# Patient Record
Sex: Female | Born: 1960 | Hispanic: No | Marital: Married | Smoking: Never smoker
Health system: Southern US, Community
[De-identification: ages and names within clinical notes are randomized; demographics above are authoritative.]

## PROBLEM LIST (undated history)

## (undated) DIAGNOSIS — E079 Disorder of thyroid, unspecified: Secondary | ICD-10-CM

## (undated) DIAGNOSIS — G901 Familial dysautonomia [Riley-Day]: Secondary | ICD-10-CM

## (undated) DIAGNOSIS — F419 Anxiety disorder, unspecified: Secondary | ICD-10-CM

## (undated) DIAGNOSIS — S139XXA Sprain of joints and ligaments of unspecified parts of neck, initial encounter: Secondary | ICD-10-CM

## (undated) DIAGNOSIS — R7303 Prediabetes: Secondary | ICD-10-CM

## (undated) DIAGNOSIS — U099 Post covid-19 condition, unspecified: Secondary | ICD-10-CM

## (undated) DIAGNOSIS — I10 Essential (primary) hypertension: Secondary | ICD-10-CM

## (undated) DIAGNOSIS — M1990 Unspecified osteoarthritis, unspecified site: Secondary | ICD-10-CM

## (undated) DIAGNOSIS — G90A Postural orthostatic tachycardia syndrome (POTS): Secondary | ICD-10-CM

## (undated) DIAGNOSIS — E039 Hypothyroidism, unspecified: Secondary | ICD-10-CM

## (undated) DIAGNOSIS — D649 Anemia, unspecified: Secondary | ICD-10-CM

## (undated) HISTORY — PX: TONSILLECTOMY: SUR1361

## (undated) HISTORY — PX: RECONSTRUCTION MANDIBLE: SUR1082

## (undated) HISTORY — DX: Sprain of joints and ligaments of unspecified parts of neck, initial encounter: S13.9XXA

## (undated) HISTORY — PX: COLONOSCOPY W/ POLYPECTOMY: SHX1380

## (undated) HISTORY — PX: FOREARM SURGERY: SHX651

---
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
## 1990-12-08 HISTORY — PX: RECONSTRUCTION MANDIBLE: SUR1082

## 2006-07-10 ENCOUNTER — Other Ambulatory Visit: Admission: RE | Admit: 2006-07-10 | Discharge: 2006-07-10 | Payer: Self-pay | Admitting: Family Medicine

## 2007-09-22 ENCOUNTER — Other Ambulatory Visit: Admission: RE | Admit: 2007-09-22 | Discharge: 2007-09-22 | Payer: Self-pay | Admitting: Family Medicine

## 2011-12-09 HISTORY — PX: EYE SURGERY: SHX253

## 2014-04-25 ENCOUNTER — Other Ambulatory Visit: Payer: Self-pay | Admitting: Internal Medicine

## 2014-04-25 ENCOUNTER — Encounter (HOSPITAL_COMMUNITY): Payer: 59 | Admitting: Anesthesiology

## 2014-04-25 ENCOUNTER — Encounter (HOSPITAL_COMMUNITY)
Admission: EM | Disposition: A | Discharge status: Home / Self Care | Payer: Self-pay | Source: Home / Self Care | Attending: Emergency Medicine

## 2014-04-25 ENCOUNTER — Encounter (HOSPITAL_COMMUNITY): Payer: Self-pay | Admitting: Emergency Medicine

## 2014-04-25 ENCOUNTER — Observation Stay (HOSPITAL_COMMUNITY)
Admission: EM | Admit: 2014-04-25 | Discharge: 2014-04-26 | Disposition: A | Discharge status: Home / Self Care | Payer: 59 | Attending: Surgery | Admitting: Surgery

## 2014-04-25 ENCOUNTER — Ambulatory Visit
Admission: RE | Admit: 2014-04-25 | Discharge: 2014-04-25 | Disposition: A | Discharge status: Home / Self Care | Payer: 59 | Source: Ambulatory Visit | Attending: Internal Medicine | Admitting: Internal Medicine

## 2014-04-25 ENCOUNTER — Observation Stay (HOSPITAL_COMMUNITY): Payer: 59 | Admitting: Anesthesiology

## 2014-04-25 DIAGNOSIS — K3580 Unspecified acute appendicitis: Secondary | ICD-10-CM

## 2014-04-25 DIAGNOSIS — R109 Unspecified abdominal pain: Secondary | ICD-10-CM

## 2014-04-25 DIAGNOSIS — E079 Disorder of thyroid, unspecified: Secondary | ICD-10-CM | POA: Insufficient documentation

## 2014-04-25 DIAGNOSIS — K3533 Acute appendicitis with perforation and localized peritonitis, with abscess: Principal | ICD-10-CM | POA: Insufficient documentation

## 2014-04-25 DIAGNOSIS — Z9049 Acquired absence of other specified parts of digestive tract: Secondary | ICD-10-CM

## 2014-04-25 HISTORY — DX: Disorder of thyroid, unspecified: E07.9

## 2014-04-25 HISTORY — PX: LAPAROSCOPIC APPENDECTOMY: SHX408

## 2014-04-25 LAB — COMPREHENSIVE METABOLIC PANEL
ALBUMIN: 3.9 g/dL (ref 3.5–5.2)
ALT: 13 U/L (ref 0–35)
AST: 16 U/L (ref 0–37)
Alkaline Phosphatase: 84 U/L (ref 39–117)
BUN: 11 mg/dL (ref 6–23)
CO2: 23 mEq/L (ref 19–32)
Calcium: 9.1 mg/dL (ref 8.4–10.5)
Chloride: 101 mEq/L (ref 96–112)
Creatinine, Ser: 0.49 mg/dL — ABNORMAL LOW (ref 0.50–1.10)
GFR calc Af Amer: >90 mL/min (ref >90)
GFR calc non Af Amer: >90 mL/min (ref >90)
Glucose, Bld: 132 mg/dL — ABNORMAL HIGH (ref 70–99)
POTASSIUM: 4 meq/L (ref 3.7–5.3)
SODIUM: 140 meq/L (ref 137–147)
TOTAL PROTEIN: 7.4 g/dL (ref 6.0–8.3)
Total Bilirubin: 0.6 mg/dL (ref 0.3–1.2)

## 2014-04-25 LAB — URINALYSIS, ROUTINE W REFLEX MICROSCOPIC
Bilirubin Urine: NEGATIVE
Glucose, UA: NEGATIVE mg/dL
Ketones, ur: 40 mg/dL — AB
NITRITE: NEGATIVE
PH: 5.5 (ref 5.0–8.0)
Protein, ur: NEGATIVE mg/dL
SPECIFIC GRAVITY, URINE: 1.015 (ref 1.005–1.030)
UROBILINOGEN UA: 1 mg/dL (ref 0.0–1.0)

## 2014-04-25 LAB — CBC WITH DIFFERENTIAL/PLATELET
BASOS PCT: 0 % (ref 0–1)
Basophils Absolute: 0 10*3/uL (ref 0.0–0.1)
EOS ABS: 0 10*3/uL (ref 0.0–0.7)
EOS PCT: 0 % (ref 0–5)
HCT: 42.8 % (ref 36.0–46.0)
Hemoglobin: 14.5 g/dL (ref 12.0–15.0)
LYMPHS ABS: 0.7 10*3/uL (ref 0.7–4.0)
Lymphocytes Relative: 5 % — ABNORMAL LOW (ref 12–46)
MCH: 30.8 pg (ref 26.0–34.0)
MCHC: 33.9 g/dL (ref 30.0–36.0)
MCV: 90.9 fL (ref 78.0–100.0)
Monocytes Absolute: 1 10*3/uL (ref 0.1–1.0)
Monocytes Relative: 6 % (ref 3–12)
NEUTROS PCT: 89 % — AB (ref 43–77)
Neutro Abs: 13.5 10*3/uL — ABNORMAL HIGH (ref 1.7–7.7)
PLATELETS: 316 10*3/uL (ref 150–400)
RBC: 4.71 MIL/uL (ref 3.87–5.11)
RDW: 12.6 % (ref 11.5–15.5)
WBC: 15.2 10*3/uL — ABNORMAL HIGH (ref 4.0–10.5)

## 2014-04-25 LAB — URINE MICROSCOPIC-ADD ON

## 2014-04-25 LAB — LIPASE, BLOOD: LIPASE: 13 U/L (ref 11–59)

## 2014-04-25 SURGERY — APPENDECTOMY, LAPAROSCOPIC
Anesthesia: General | Site: Abdomen

## 2014-04-25 MED ORDER — SODIUM CHLORIDE 0.9 % IR SOLN
Status: DC | PRN
  Administered 2014-04-25: 1000 mL

## 2014-04-25 MED ORDER — PROGESTERONE MICRONIZED 200 MG PO CAPS
200.0000 mg | ORAL_CAPSULE | Freq: Every day | ORAL | Status: DC
  Administered 2014-04-26: 200 mg via ORAL
  Filled 2014-04-25 (×2): qty 1

## 2014-04-25 MED ORDER — DEXAMETHASONE SODIUM PHOSPHATE 10 MG/ML IJ SOLN
INTRAMUSCULAR | Status: DC | PRN
  Administered 2014-04-25: 4 mg via INTRAVENOUS

## 2014-04-25 MED ORDER — LIDOCAINE HCL (CARDIAC) 20 MG/ML IV SOLN
INTRAVENOUS | Status: DC | PRN
  Administered 2014-04-25: 100 mg via INTRAVENOUS

## 2014-04-25 MED ORDER — MORPHINE SULFATE 2 MG/ML IJ SOLN: 2.0000 mg | INTRAMUSCULAR | Status: DC | PRN

## 2014-04-25 MED ORDER — SODIUM CHLORIDE 0.9 % IV SOLN
INTRAVENOUS | Status: DC | PRN
  Administered 2014-04-25: 19:00:00 via INTRAVENOUS

## 2014-04-25 MED ORDER — BUPIVACAINE-EPINEPHRINE (PF) 0.25% -1:200000 IJ SOLN
INTRAMUSCULAR | Status: AC
  Filled 2014-04-25: qty 30

## 2014-04-25 MED ORDER — DEXTROSE 5 % IV SOLN
1.0000 g | Freq: Once | INTRAVENOUS | Status: AC
  Administered 2014-04-25: 1 g via INTRAVENOUS
  Filled 2014-04-25: qty 10

## 2014-04-25 MED ORDER — MIDAZOLAM HCL 5 MG/5ML IJ SOLN
INTRAMUSCULAR | Status: DC | PRN
  Administered 2014-04-25: 2 mg via INTRAVENOUS

## 2014-04-25 MED ORDER — ONDANSETRON HCL 4 MG/2ML IJ SOLN
INTRAMUSCULAR | Status: DC | PRN
  Administered 2014-04-25: 4 mg via INTRAVENOUS

## 2014-04-25 MED ORDER — MORPHINE SULFATE 4 MG/ML IJ SOLN
4.0000 mg | INTRAMUSCULAR | Status: DC | PRN
  Administered 2014-04-25: 4 mg via INTRAVENOUS
  Filled 2014-04-25: qty 1

## 2014-04-25 MED ORDER — FENTANYL CITRATE 0.05 MG/ML IJ SOLN
INTRAMUSCULAR | Status: AC
  Filled 2014-04-25: qty 5

## 2014-04-25 MED ORDER — SUCCINYLCHOLINE CHLORIDE 20 MG/ML IJ SOLN
INTRAMUSCULAR | Status: DC | PRN
  Administered 2014-04-25: 100 mg via INTRAVENOUS

## 2014-04-25 MED ORDER — SERTRALINE HCL 50 MG PO TABS
50.0000 mg | ORAL_TABLET | Freq: Every day | ORAL | Status: DC
  Administered 2014-04-26: 50 mg via ORAL
  Filled 2014-04-25: qty 1

## 2014-04-25 MED ORDER — ROCURONIUM BROMIDE 50 MG/5ML IV SOLN
INTRAVENOUS | Status: AC
  Filled 2014-04-25: qty 1

## 2014-04-25 MED ORDER — FENTANYL CITRATE 0.05 MG/ML IJ SOLN
INTRAMUSCULAR | Status: DC | PRN
  Administered 2014-04-25: 150 ug via INTRAVENOUS

## 2014-04-25 MED ORDER — MIDAZOLAM HCL 2 MG/2ML IJ SOLN
INTRAMUSCULAR | Status: AC
  Filled 2014-04-25: qty 2

## 2014-04-25 MED ORDER — 0.9 % SODIUM CHLORIDE (POUR BTL) OPTIME
TOPICAL | Status: DC | PRN
  Administered 2014-04-25: 1000 mL

## 2014-04-25 MED ORDER — BUPIVACAINE-EPINEPHRINE 0.25% -1:200000 IJ SOLN
INTRAMUSCULAR | Status: DC | PRN
  Administered 2014-04-25: 15 mL

## 2014-04-25 MED ORDER — ONDANSETRON HCL 4 MG/2ML IJ SOLN
4.0000 mg | INTRAMUSCULAR | Status: DC | PRN
  Administered 2014-04-25: 4 mg via INTRAVENOUS
  Filled 2014-04-25: qty 2

## 2014-04-25 MED ORDER — SODIUM CHLORIDE 0.9 % IV SOLN: INTRAVENOUS | Status: AC

## 2014-04-25 MED ORDER — NEOSTIGMINE METHYLSULFATE 10 MG/10ML IV SOLN
INTRAVENOUS | Status: DC | PRN
  Administered 2014-04-25: 4 mg via INTRAVENOUS

## 2014-04-25 MED ORDER — SODIUM CHLORIDE 0.9 % IV SOLN
INTRAVENOUS | Status: DC
  Administered 2014-04-25: 17:00:00 via INTRAVENOUS

## 2014-04-25 MED ORDER — SUCCINYLCHOLINE CHLORIDE 20 MG/ML IJ SOLN
INTRAMUSCULAR | Status: AC
  Filled 2014-04-25: qty 1

## 2014-04-25 MED ORDER — HEPARIN SODIUM (PORCINE) 5000 UNIT/ML IJ SOLN
5000.0000 [IU] | Freq: Three times a day (TID) | INTRAMUSCULAR | Status: DC
  Administered 2014-04-26: 5000 [IU] via SUBCUTANEOUS
  Filled 2014-04-25 (×3): qty 1

## 2014-04-25 MED ORDER — KCL IN DEXTROSE-NACL 20-5-0.45 MEQ/L-%-% IV SOLN
INTRAVENOUS | Status: DC
  Administered 2014-04-26: 07:00:00 via INTRAVENOUS
  Administered 2014-04-26: 100 mL/h via INTRAVENOUS
  Filled 2014-04-25 (×3): qty 1000

## 2014-04-25 MED ORDER — PROPOFOL 10 MG/ML IV BOLUS
INTRAVENOUS | Status: DC | PRN
Start: 2014-04-25 — End: 2014-04-25
  Administered 2014-04-25: 120 mg via INTRAVENOUS

## 2014-04-25 MED ORDER — ONDANSETRON HCL 4 MG/2ML IJ SOLN: 4.0000 mg | Freq: Four times a day (QID) | INTRAMUSCULAR | Status: DC | PRN

## 2014-04-25 MED ORDER — GLYCOPYRROLATE 0.2 MG/ML IJ SOLN
INTRAMUSCULAR | Status: DC | PRN
  Administered 2014-04-25: 0.6 mg via INTRAVENOUS

## 2014-04-25 MED ORDER — ACETAMINOPHEN 325 MG PO TABS: 650.0000 mg | ORAL_TABLET | ORAL | Status: DC | PRN

## 2014-04-25 MED ORDER — OXYCODONE-ACETAMINOPHEN 5-325 MG PO TABS
1.0000 | ORAL_TABLET | ORAL | Status: DC | PRN
  Administered 2014-04-25 – 2014-04-26 (×2): 1 via ORAL
  Filled 2014-04-25: qty 1
  Filled 2014-04-25: qty 2

## 2014-04-25 MED ORDER — ONDANSETRON HCL 4 MG PO TABS: 4.0000 mg | ORAL_TABLET | Freq: Four times a day (QID) | ORAL | Status: DC | PRN

## 2014-04-25 MED ORDER — METRONIDAZOLE IN NACL 5-0.79 MG/ML-% IV SOLN
500.0000 mg | Freq: Once | INTRAVENOUS | Status: AC
  Administered 2014-04-25: 500 mg via INTRAVENOUS
  Filled 2014-04-25: qty 100

## 2014-04-25 MED ORDER — ROCURONIUM BROMIDE 100 MG/10ML IV SOLN
INTRAVENOUS | Status: DC | PRN
  Administered 2014-04-25: 30 mg via INTRAVENOUS

## 2014-04-25 SURGICAL SUPPLY — 51 items
ADH SKN CLS APL DERMABOND .7 (GAUZE/BANDAGES/DRESSINGS) ×1
APPLIER CLIP ROT 10 11.4 M/L (STAPLE)
APR CLP MED LRG 11.4X10 (STAPLE)
BAG SPEC RTRVL LRG 6X4 10 (ENDOMECHANICALS) ×1
BLADE SURG ROTATE 9660 (MISCELLANEOUS) IMPLANT
CANISTER SUCTION 2500CC (MISCELLANEOUS) ×3 IMPLANT
CHLORAPREP W/TINT 26ML (MISCELLANEOUS) ×3 IMPLANT
CLIP APPLIE ROT 10 11.4 M/L (STAPLE) IMPLANT
COVER SURGICAL LIGHT HANDLE (MISCELLANEOUS) ×5 IMPLANT
CUTTER LINEAR ENDO 35 ETS (STAPLE) IMPLANT
CUTTER LINEAR ENDO 35 ETS TH (STAPLE) ×2 IMPLANT
DECANTER SPIKE VIAL GLASS SM (MISCELLANEOUS) ×3 IMPLANT
DERMABOND ADVANCED (GAUZE/BANDAGES/DRESSINGS) ×2
DERMABOND ADVANCED .7 DNX12 (GAUZE/BANDAGES/DRESSINGS) ×1 IMPLANT
DRAPE UTILITY 15X26 W/TAPE STR (DRAPE) ×6 IMPLANT
ELECT REM PT RETURN 9FT ADLT (ELECTROSURGICAL) ×3
ELECTRODE REM PT RTRN 9FT ADLT (ELECTROSURGICAL) ×1 IMPLANT
ENDOLOOP SUT PDS II  0 18 (SUTURE)
ENDOLOOP SUT PDS II 0 18 (SUTURE) IMPLANT
GLOVE BIO SURGEON STRL SZ8 (GLOVE) ×3 IMPLANT
GLOVE BIOGEL M 6.5 STRL (GLOVE) ×2 IMPLANT
GLOVE BIOGEL PI IND STRL 6.5 (GLOVE) IMPLANT
GLOVE BIOGEL PI IND STRL 8 (GLOVE) ×1 IMPLANT
GLOVE BIOGEL PI INDICATOR 6.5 (GLOVE) ×4
GLOVE BIOGEL PI INDICATOR 8 (GLOVE) ×2
GOWN STRL REUS W/ TWL LRG LVL3 (GOWN DISPOSABLE) ×2 IMPLANT
GOWN STRL REUS W/ TWL XL LVL3 (GOWN DISPOSABLE) ×1 IMPLANT
GOWN STRL REUS W/TWL LRG LVL3 (GOWN DISPOSABLE) ×6
GOWN STRL REUS W/TWL XL LVL3 (GOWN DISPOSABLE) ×3
KIT BASIN OR (CUSTOM PROCEDURE TRAY) ×3 IMPLANT
KIT ROOM TURNOVER OR (KITS) ×3 IMPLANT
NEEDLE 22X1 1/2 (OR ONLY) (NEEDLE) ×3 IMPLANT
NS IRRIG 1000ML POUR BTL (IV SOLUTION) ×3 IMPLANT
PAD ARMBOARD 7.5X6 YLW CONV (MISCELLANEOUS) ×6 IMPLANT
POUCH SPECIMEN RETRIEVAL 10MM (ENDOMECHANICALS) ×3 IMPLANT
RELOAD /EVU35 (ENDOMECHANICALS) IMPLANT
RELOAD CUTTER ETS 35MM STAND (ENDOMECHANICALS) IMPLANT
SCALPEL HARMONIC ACE (MISCELLANEOUS) ×3 IMPLANT
SET IRRIG TUBING LAPAROSCOPIC (IRRIGATION / IRRIGATOR) ×3 IMPLANT
SPECIMEN JAR SMALL (MISCELLANEOUS) ×3 IMPLANT
SUT VIC AB 4-0 PS2 27 (SUTURE) ×3 IMPLANT
SWAB COLLECTION DEVICE MRSA (MISCELLANEOUS) IMPLANT
TOWEL OR 17X24 6PK STRL BLUE (TOWEL DISPOSABLE) ×3 IMPLANT
TOWEL OR 17X26 10 PK STRL BLUE (TOWEL DISPOSABLE) ×3 IMPLANT
TRAY FOLEY CATH 16FR SILVER (SET/KITS/TRAYS/PACK) ×3 IMPLANT
TRAY LAPAROSCOPIC (CUSTOM PROCEDURE TRAY) ×3 IMPLANT
TROCAR XCEL 12X100 BLDLESS (ENDOMECHANICALS) ×3 IMPLANT
TROCAR XCEL BLUNT TIP 100MML (ENDOMECHANICALS) ×3 IMPLANT
TROCAR XCEL NON-BLD 5MMX100MML (ENDOMECHANICALS) ×3 IMPLANT
TUBE ANAEROBIC SPECIMEN COL (MISCELLANEOUS) IMPLANT
WATER STERILE IRR 1000ML POUR (IV SOLUTION) ×1 IMPLANT

## 2014-04-25 NOTE — ED Notes (Signed)
Pt was sent here from pcp office. Pt woke up this am with generalized abd pain and n/v. Had ct scan done and +appendicitis.

## 2014-04-25 NOTE — Anesthesia Preprocedure Evaluation (Signed)
Anesthesia Evaluation  Patient identified by MRN, date of birth, ID band Patient awake    Reviewed: Allergy & Precautions, H&P , NPO status , Patient's Chart, lab work & pertinent test results  Airway Mallampati: I TM Distance: >3 FB Neck ROM: Full    Dental   Pulmonary          Cardiovascular     Neuro/Psych    GI/Hepatic   Endo/Other    Renal/GU      Musculoskeletal   Abdominal   Peds  Hematology   Anesthesia Other Findings   Reproductive/Obstetrics                           Anesthesia Physical Anesthesia Plan  ASA: II  Anesthesia Plan: General   Post-op Pain Management:    Induction: Intravenous, Rapid sequence and Cricoid pressure planned  Airway Management Planned: Oral ETT  Additional Equipment:   Intra-op Plan:   Post-operative Plan: Extubation in OR  Informed Consent: I have reviewed the patients History and Physical, chart, labs and discussed the procedure including the risks, benefits and alternatives for the proposed anesthesia with the patient or authorized representative who has indicated his/her understanding and acceptance.     Plan Discussed with: CRNA and Surgeon  Anesthesia Plan Comments:         Anesthesia Quick Evaluation

## 2014-04-25 NOTE — ED Notes (Signed)
Surgery at bedside.

## 2014-04-25 NOTE — Transfer of Care (Signed)
Immediate Anesthesia Transfer of Care Note  Patient: Julia Murphy  Procedure(s) Performed: Procedure(s): APPENDECTOMY LAPAROSCOPIC (N/A)  Patient Location: PACU  Anesthesia Type:General  Level of Consciousness: awake, alert  and oriented  Airway & Oxygen Therapy: Patient Spontanous Breathing and Patient connected to nasal cannula oxygen  Post-op Assessment: Report given to PACU RN, Post -op Vital signs reviewed and stable and Patient moving all extremities X 4  Post vital signs: Reviewed and stable  Complications: No apparent anesthesia complications

## 2014-04-25 NOTE — ED Notes (Signed)
OR called said they will be available to take the pt shortly and to not send pt to 6N yet.

## 2014-04-25 NOTE — H&P (Signed)
Julia Murphy is an 53 y.o. female.   Chief Complaint: RLQ abdominal pain HPI: patient initially developed lower abdominal pain yesterday morning. She went to work and it seemed to get a little bit better. Last night, she awoke from sleep with more severe pain localizing to the right lower quadrant. She was evaluated by her primary care physician,  Dr. Donette LarryHusain, and referred for CT scan of the abdomen and pelvis. This revealed acute appendicitis and she was sent to the emergency department. She continues to have right lower quadrant pain. She has associated nausea and previously was vomiting.  Past Medical History  Diagnosis Date  . Thyroid disease     History reviewed. No pertinent past surgical history.  History reviewed. No pertinent family history. Social History:  reports that she has never smoked. She does not have any smokeless tobacco history on file. She reports that she drinks alcohol. She reports that she does not use illicit drugs.  Allergies:  Allergies  Allergen Reactions  . Gluten Meal Hives     (Not in a hospital admission)  Results for orders placed during the hospital encounter of 04/25/14 (from the past 48 hour(s))  CBC WITH DIFFERENTIAL     Status: Abnormal   Collection Time    04/25/14  5:14 PM      Result Value Ref Range   WBC 15.2 (*) 4.0 - 10.5 K/uL   RBC 4.71  3.87 - 5.11 MIL/uL   Hemoglobin 14.5  12.0 - 15.0 g/dL   HCT 16.142.8  09.636.0 - 04.546.0 %   MCV 90.9  78.0 - 100.0 fL   MCH 30.8  26.0 - 34.0 pg   MCHC 33.9  30.0 - 36.0 g/dL   RDW 40.912.6  81.111.5 - 91.415.5 %   Platelets 316  150 - 400 K/uL   Neutrophils Relative % 89 (*) 43 - 77 %   Neutro Abs 13.5 (*) 1.7 - 7.7 K/uL   Lymphocytes Relative 5 (*) 12 - 46 %   Lymphs Abs 0.7  0.7 - 4.0 K/uL   Monocytes Relative 6  3 - 12 %   Monocytes Absolute 1.0  0.1 - 1.0 K/uL   Eosinophils Relative 0  0 - 5 %   Eosinophils Absolute 0.0  0.0 - 0.7 K/uL   Basophils Relative 0  0 - 1 %   Basophils Absolute 0.0  0.0 - 0.1 K/uL    Ct Abdomen Pelvis Wo Contrast  04/25/2014   CLINICAL DATA:  Abdominal pain. Since last night extending from the epigastrium to the pelvis with associated nausea vomiting constipation and microscopic hematuria.  EXAM: CT ABDOMEN AND PELVIS WITHOUT CONTRAST  TECHNIQUE: Multidetector CT imaging of the abdomen and pelvis was performed following the standard protocol without IV contrast.  COMPARISON:  Supine and upright abdominal films dated Apr 25, 2014.  FINDINGS: The liver exhibits no ductal dilation. There is a hypodensity in the left lobe anteriorly measuring 8 x 13 mm. It exhibits Hounsfield measurement of +15 and likely reflects a cyst. There is a smaller similar appearing subcapsular finding laterally in the right lobe which exhibits Hounsfield measurement of +3. The gallbladder is adequately distended with no evidence of stones or surrounding inflammatory change. The pancreas, spleen, nondistended stomach, adrenal glands, and kidneys are normal in appearance. The caliber of the abdominal aorta is normal. The periaortic and pericaval regions appear normal. The psoas musculature is normal in appearance.  There is a structure in the right aspect of the  pelvic cavity which contains a small amount of calcification consistent with an inflamed edematous appendix. The maximal measured diameter is approximately 14 mm. There is a small amount of increased density in the periappendiceal fat. The small and large bowel exhibit no acute abnormalities otherwise. The uterus and adnexal structures and urinary bladder are normal in appearance. The lumbar vertebral bodies are preserved in height. The bony pelvis exhibits no acute abnormality. The lung bases are clear.  IMPRESSION: 1. The findings are consistent with an acutely inflamed appendix. The appendix lies deep within the pelvis just lateral to the right adnexal structures. The bowel otherwise appears normal. 2. Hypodensities in the liver likely reflect cysts. No acute  hepatobiliary abnormality is demonstrated. 3. No acute urinary tract abnormality is demonstrated. 4. These results were called by telephone at the time of interpretation on 04/25/2014 at 4:18 PM to Dr. Georgann HousekeeperKARRAR HUSAIN , who verbally acknowledged these results.   Electronically Signed   By: David  [place]   On: 04/25/2014 16:24   Dg Abd 2 Views  04/25/2014   CLINICAL DATA:  Abdominal pain and vomiting  EXAM: ABDOMEN - 2 VIEW  COMPARISON:  None.  FINDINGS: The bowel gas pattern is nonspecific. There small amounts of gas within normal caliber small bowel loops. There is a small amount of gas within normal caliber colon. There is stool and gas in the descending colon. There is gas in the region of the rectum. No free extraluminal gas collections are demonstrated. The lung bases are clear. The lumbar spine and bony pelvis appear normal where visualized.  IMPRESSION: The bowel gas pattern is nonspecific. There is no evidence of obstruction or significant ileus.   Electronically Signed   By: David  [place]   On: 04/25/2014 14:38    Review of Systems  Constitutional: Positive for malaise/fatigue.  HENT: Negative.   Eyes: Negative.   Respiratory: Negative.   Cardiovascular: Negative.   Gastrointestinal: Positive for nausea, vomiting and abdominal pain.  Genitourinary: Negative.   Musculoskeletal:       Left foot fractured metatarsal  Skin: Negative.   Endo/Heme/Allergies: Negative.   Psychiatric/Behavioral: Negative.     Blood pressure 123/76, pulse 89, temperature 99.5 F (37.5 C), temperature source Oral, resp. rate 18, height 5\' 8"  (1.727 m), weight 166 lb 1.6 oz (75.342 kg), SpO2 98.00%. Physical Exam  Constitutional: She is oriented to person, place, and time. She appears well-developed and well-nourished. No distress.  HENT:  Head: Normocephalic and atraumatic.  Nose: Nose normal.  Mouth/Throat: No oropharyngeal exudate.  Eyes: Pupils are equal, round, and reactive to light. No scleral icterus.   Neck: Normal range of motion. Neck supple. No tracheal deviation present.  Cardiovascular: Normal rate and normal heart sounds.   Respiratory: Breath sounds normal. No stridor. No respiratory distress. She has no wheezes. She has no rales.  GI: Soft. She exhibits no distension. There is tenderness. There is no rebound and no guarding.  Tenderness right lower quadrant without guarding, no generalized peritonitis  Musculoskeletal:  Evolving contusion and tenderness left forefoot  Neurological: She is alert and oriented to person, place, and time.  Skin: Skin is warm and dry.  Psychiatric: She has a normal mood and affect.     Assessment/Plan Acute appendicitis: wewill start IV antibiotics and proceed to the operating room tonight for laparoscopic appendectomy. Procedure, risks, and benefits were discussed in detail with the patient and her husband. She is agreeable.  Liz MaladyBurke E Lachae Hohler 04/25/2014, 5:46 PM

## 2014-04-25 NOTE — Op Note (Signed)
04/25/2014  8:00 PM  PATIENT:  Julia Murphy  53 y.o. female  PRE-OPERATIVE DIAGNOSIS:  Acute Appendicitis  POST-OPERATIVE DIAGNOSIS:  Acute Appendicitis  PROCEDURE:  Procedure(s): APPENDECTOMY LAPAROSCOPIC  SURGEON:  Surgeon(s): Liz MaladyBurke E Kemia Wendel, MD  ASSISTANTS: none   ANESTHESIA:   local and general  EBL:  Total I/O In: 400 [I.V.:400] Out: 100 [Urine:100]  BLOOD ADMINISTERED:none  DRAINS: none   SPECIMEN:  Excision  DISPOSITION OF SPECIMEN:  PATHOLOGY  COUNTS:  YES  DICTATION: .Dragon Dictation patient presented to the emergency department with CT confirmed acute appendicitis. She is brought for appendectomy. She received intravenous antibiotics. Informed consent was obtained. She was identified in the preop holding area. She was brought to the operating room and general endotracheal anesthesia was administered by the anesthesia staff. Foley catheter was placed by nursing. Abdomen was prepped and draped in sterile fashion. Time out procedure was done. Infraumbilical region was infiltrated with local. Infraumbilical incision was made. Subcutaneous tissues were dissected down revealing the anterior fascia. This was divided sharply along the midline.peritoneal cavity was entered under direct vision. 0 Vicryl pursestring was placed around the fascial opening. Hassan trocar was inserted. Abdomen was insufflated with carbon dioxide. Under direct vision, a 12 mm left lower quadrant and 5 mm right mid port were placed. Local was used at each port site. Laparoscopic exploration revealed a very inflamed and tense but not perforated appendix. The mesoappendix was divided with harmonic scalpel achieving excellent hemostasis. The base of the appendix was intact. The base was divided with Endo GIA stapler with vascular load. There was excellent staple closure. Appendix was placed in an Endo Catch bag and removed from the left lower quadrant port site. The abdomen was copiously irrigated. There  was no bleeding. Staple line was intact. Ports were removed under direct vision. Pneumoperitoneum was released. Infraumbilical fascia was closed by tying the pursestring. All 3 wounds were copiously irrigated and the skin of each was closed with running 4 Vicryl subcuticular followed by Dermabond. All counts were correct. Patient tolerated procedure well without apparent complication was taken recovery in stable condition.  PATIENT DISPOSITION:  PACU - hemodynamically stable.   Delay start of Pharmacological VTE agent (>24hrs) due to surgical blood loss or risk of bleeding:  no  Violeta GelinasBurke Julia Barthel, MD, MPH, FACS Pager: [phone]  5/19/20158:00 PM

## 2014-04-25 NOTE — ED Notes (Signed)
Pharmacy tech at bedside 

## 2014-04-25 NOTE — Anesthesia Procedure Notes (Signed)
Procedure Name: Intubation Date/Time: 04/25/2014 7:06 PM Performed by: Molli HazardGORDON, [name] Pre-anesthesia Checklist: Patient identified, Emergency Drugs available, Patient being monitored and Suction available Patient Re-evaluated:Patient Re-evaluated prior to inductionOxygen Delivery Method: Circle system utilized Preoxygenation: Pre-oxygenation with 100% oxygen Intubation Type: IV induction, Rapid sequence and Cricoid Pressure applied Laryngoscope Size: Miller and 2 Grade View: Grade I Tube type: Oral Tube size: 7.5 mm Number of attempts: 1 Airway Equipment and Method: Stylet Placement Confirmation: ETT inserted through vocal cords under direct vision,  positive ETCO2 and breath sounds checked- equal and bilateral Secured at: 23 cm Tube secured with: Tape Dental Injury: Teeth and Oropharynx as per pre-operative assessment

## 2014-04-25 NOTE — ED Notes (Signed)
Paperwork from MD faxed to [facility]

## 2014-04-25 NOTE — ED Notes (Signed)
Dr. Clarene DukeMcmanus at bedside, aware of pt's request for pain medicine.

## 2014-04-25 NOTE — ED Notes (Signed)
Pt c/o RLQ abd pain that radiates to mid lower abd and then up into mid-epigastric area, sts the pain started yesterday and worsened throughout the night so she called PCP and then had a CT scan. Scan showed she has appendicitis. Pt c/o extreme nasusea and pain. sts she did take some phenergan PTA and has gotten some relief from that. Nad, skin warm and dry, resp e/u.

## 2014-04-25 NOTE — ED Provider Notes (Signed)
CSN: [id number]     Arrival date & time 04/25/14  1637 History   First MD Initiated Contact with Patient 04/25/14 1656     Chief Complaint  Patient presents with  . Abdominal Pain      HPI Pt was seen at 1730.  Per pt, c/o gradual onset and worsening of persistent generalized abd "pain" since yesterday.  Has been associated with multiple intermittent episodes of N/V. Pt was evaluated by her PMD today, and sent to the hospital for an outpatient CT scan.  Pt was called and told to come to the ED for further evaluation/admission for dx acute appendicitis seen on CT scan.  Denies diarrhea, no fevers, no back pain, no rash, no CP/SOB, no black or blood in stools or emesis.      Past Medical History  Diagnosis Date  . Thyroid disease    History reviewed. No pertinent past surgical history.  History  Substance Use Topics  . Smoking status: Never Smoker   . Smokeless tobacco: Not on file  . Alcohol Use: Yes     Comment: occ    Review of Systems ROS: Statement: All systems negative except as marked or noted in the HPI; Constitutional: Negative for fever and chills. ; ; Eyes: Negative for eye pain, redness and discharge. ; ; ENMT: Negative for ear pain, hoarseness, nasal congestion, sinus pressure and sore throat. ; ; Cardiovascular: Negative for chest pain, palpitations, diaphoresis, dyspnea and peripheral edema. ; ; Respiratory: Negative for cough, wheezing and stridor. ; ; Gastrointestinal: +N/V, abd pain. Negative for diarrhea, blood in stool, hematemesis, jaundice and rectal bleeding. . ; ; Genitourinary: Negative for dysuria, flank pain and hematuria. ; ; Musculoskeletal: Negative for back pain and neck pain. Negative for swelling and trauma.; ; Skin: Negative for pruritus, rash, abrasions, blisters, bruising and skin lesion.; ; Neuro: Negative for headache, lightheadedness and neck stiffness. Negative for weakness, altered level of consciousness , altered mental status, extremity weakness,  paresthesias, involuntary movement, seizure and syncope.        Allergies  Gluten meal  Home Medications   Prior to Admission medications   Medication Sig Start Date End Date Taking? Authorizing Provider  Thyroid (NATURE-THROID) 113.75 MG TABS Take 1 tablet by mouth 2 (two) times daily.   Yes Historical Provider, MD   BP 123/70  Pulse 82  Temp(Src) 99.5 F (37.5 C) (Oral)  Resp 18  Ht 5\' 8"  (1.727 m)  Wt 166 lb 1.6 oz (75.342 kg)  BMI 25.26 kg/m2  SpO2 93% Physical Exam 1735: Physical examination:  Nursing notes reviewed; Vital signs and O2 SAT reviewed;  Constitutional: Well developed, Well nourished, Well hydrated, Uncomfortable appearing.; Head:  Normocephalic, atraumatic; Eyes: EOMI, PERRL, No scleral icterus; ENMT: Mouth and pharynx normal, Mucous membranes moist; Neck: Supple, Full range of motion, No lymphadenopathy; Cardiovascular: Regular rate and rhythm, No murmur, rub, or gallop; Respiratory: Breath sounds clear & equal bilaterally, No rales, rhonchi, wheezes.  Speaking full sentences with ease, Normal respiratory effort/excursion; Chest: Nontender, Movement normal; Abdomen: Soft, +RLQ tenderness to palp. Nondistended, Normal bowel sounds; Genitourinary: No CVA tenderness; Extremities: Pulses normal, No tenderness, No edema, No calf edema or asymmetry.; Neuro: AA&Ox3, Major CN grossly intact.  Speech clear. No gross focal motor or sensory deficits in extremities.; Skin: Color normal, Warm, Dry.   ED Course  Procedures     EKG Interpretation None      MDM  MDM Reviewed: previous chart, nursing note and vitals Reviewed previous: labs  and CT scan    Results for orders placed during the hospital encounter of 04/25/14  CBC WITH DIFFERENTIAL      Result Value Ref Range   WBC 15.2 (*) 4.0 - 10.5 K/uL   RBC 4.71  3.87 - 5.11 MIL/uL   Hemoglobin 14.5  12.0 - 15.0 g/dL   HCT 16.142.8  09.636.0 - 04.546.0 %   MCV 90.9  78.0 - 100.0 fL   MCH 30.8  26.0 - 34.0 pg   MCHC 33.9   30.0 - 36.0 g/dL   RDW 40.912.6  81.111.5 - 91.415.5 %   Platelets 316  150 - 400 K/uL   Neutrophils Relative % 89 (*) 43 - 77 %   Neutro Abs 13.5 (*) 1.7 - 7.7 K/uL   Lymphocytes Relative 5 (*) 12 - 46 %   Lymphs Abs 0.7  0.7 - 4.0 K/uL   Monocytes Relative 6  3 - 12 %   Monocytes Absolute 1.0  0.1 - 1.0 K/uL   Eosinophils Relative 0  0 - 5 %   Eosinophils Absolute 0.0  0.0 - 0.7 K/uL   Basophils Relative 0  0 - 1 %   Basophils Absolute 0.0  0.0 - 0.1 K/uL  COMPREHENSIVE METABOLIC PANEL      Result Value Ref Range   Sodium 140  137 - 147 mEq/L   Potassium 4.0  3.7 - 5.3 mEq/L   Chloride 101  96 - 112 mEq/L   CO2 23  19 - 32 mEq/L   Glucose, Bld 132 (*) 70 - 99 mg/dL   BUN 11  6 - 23 mg/dL   Creatinine, Ser 7.820.49 (*) 0.50 - 1.10 mg/dL   Calcium 9.1  8.4 - 95.610.5 mg/dL   Total Protein 7.4  6.0 - 8.3 g/dL   Albumin 3.9  3.5 - 5.2 g/dL   AST 16  0 - 37 U/L   ALT 13  0 - 35 U/L   Alkaline Phosphatase 84  39 - 117 U/L   Total Bilirubin 0.6  0.3 - 1.2 mg/dL   GFR calc non Af Amer >90  >90 mL/min   GFR calc Af Amer >90  >90 mL/min  LIPASE, BLOOD      Result Value Ref Range   Lipase 13  11 - 59 U/L   Ct Abdomen Pelvis Wo Contrast 04/25/2014   CLINICAL DATA:  Abdominal pain. Since last night extending from the epigastrium to the pelvis with associated nausea vomiting constipation and microscopic hematuria.  EXAM: CT ABDOMEN AND PELVIS WITHOUT CONTRAST  TECHNIQUE: Multidetector CT imaging of the abdomen and pelvis was performed following the standard protocol without IV contrast.  COMPARISON:  Supine and upright abdominal films dated Apr 25, 2014.  FINDINGS: The liver exhibits no ductal dilation. There is a hypodensity in the left lobe anteriorly measuring 8 x 13 mm. It exhibits Hounsfield measurement of +15 and likely reflects a cyst. There is a smaller similar appearing subcapsular finding laterally in the right lobe which exhibits Hounsfield measurement of +3. The gallbladder is adequately distended  with no evidence of stones or surrounding inflammatory change. The pancreas, spleen, nondistended stomach, adrenal glands, and kidneys are normal in appearance. The caliber of the abdominal aorta is normal. The periaortic and pericaval regions appear normal. The psoas musculature is normal in appearance.  There is a structure in the right aspect of the pelvic cavity which contains a small amount of calcification consistent with an inflamed edematous appendix. The maximal  measured diameter is approximately 14 mm. There is a small amount of increased density in the periappendiceal fat. The small and large bowel exhibit no acute abnormalities otherwise. The uterus and adnexal structures and urinary bladder are normal in appearance. The lumbar vertebral bodies are preserved in height. The bony pelvis exhibits no acute abnormality. The lung bases are clear.  IMPRESSION: 1. The findings are consistent with an acutely inflamed appendix. The appendix lies deep within the pelvis just lateral to the right adnexal structures. The bowel otherwise appears normal. 2. Hypodensities in the liver likely reflect cysts. No acute hepatobiliary abnormality is demonstrated. 3. No acute urinary tract abnormality is demonstrated. 4. These results were called by telephone at the time of interpretation on 04/25/2014 at 4:18 PM to Dr. Georgann HousekeeperKARRAR HUSAIN , who verbally acknowledged these results.   Electronically Signed   By: David  [place]   On: 04/25/2014 16:24   Dg Abd 2 Views 04/25/2014   CLINICAL DATA:  Abdominal pain and vomiting  EXAM: ABDOMEN - 2 VIEW  COMPARISON:  None.  FINDINGS: The bowel gas pattern is nonspecific. There small amounts of gas within normal caliber small bowel loops. There is a small amount of gas within normal caliber colon. There is stool and gas in the descending colon. There is gas in the region of the rectum. No free extraluminal gas collections are demonstrated. The lung bases are clear. The lumbar spine and bony  pelvis appear normal where visualized.  IMPRESSION: The bowel gas pattern is nonspecific. There is no evidence of obstruction or significant ileus.   Electronically Signed   By: David  [place]   On: 04/25/2014 14:38    1715:  T/C to General Surgery Dr. Janee Mornhompson, case discussed, including:  HPI, pertinent PM/SHx, VS/PE, dx testing, ED course and treatment:  Agreeable to admit, requests to start IV rocephin/flagyl, he will come to the ED for evaluation to admit.     Laray AngerKathleen M Marguriete Wootan, DO 04/28/14 2337

## 2014-04-25 NOTE — Anesthesia Postprocedure Evaluation (Signed)
Anesthesia Post Note  Patient: Julia Murphy  Procedure(s) Performed: Procedure(s) (LRB): APPENDECTOMY LAPAROSCOPIC (N/A)  Anesthesia type: general  Patient location: PACU  Post pain: Pain level controlled  Post assessment: Patient's Cardiovascular Status Stable  Last Vitals:  Filed Vitals:   04/25/14 2131  BP: 122/66  Pulse: 93  Temp: 37.2 C  Resp: 20    Post vital signs: Reviewed and stable  Level of consciousness: sedated  Complications: No apparent anesthesia complications

## 2014-04-26 MED ORDER — OXYCODONE-ACETAMINOPHEN 5-325 MG PO TABS: 1.0000 | ORAL_TABLET | ORAL | Status: DC | PRN

## 2014-04-26 NOTE — Discharge Planning (Signed)
Patient discharged home in stable condition. Verbalizes understanding of all discharge instructions, including home medications and follow up appointments. 

## 2014-04-26 NOTE — Discharge Summary (Signed)
Patient ID: Julia Murphy MRN: [id number] DOB/AGE: [birth year] 53 y.o.  Admit date: 04/25/2014 Discharge date: 04/26/2014  Procedures: lap appy  Consults: None  Reason for Admission: patient initially developed lower abdominal pain yesterday morning. She went to work and it seemed to get a little bit better. Last night, she awoke from sleep with more severe pain localizing to the right lower quadrant. She was evaluated by her primary care physician, Dr. Donette LarryHusain, and referred for CT scan of the abdomen and pelvis. This revealed acute appendicitis and she was sent to the emergency department. She continues to have right lower quadrant pain. She has associated nausea and previously was vomiting.  Admission Diagnoses:  1. Acute appendicitis  Hospital Course: the patient was admitted and taken to the OR where she underwent a lap appy.  She tolerated this well and was eating a solid diet and taking oral pain meds on POD 1.  She was stable for dc home.  PE: Abd: soft, appropriately tender, +Bs, Nd, incisions c/d/i  Discharge Diagnoses:  Active Problems:   Acute appendicitis   S/P laparoscopic appendectomy   Discharge Medications:   Medication List         cholecalciferol 1000 UNITS tablet  Commonly known as:  VITAMIN D  Take 1,000 Units by mouth daily.     estradiol 0.075 mg/24hr patch  Commonly known as:  CLIMARA - Dosed in mg/24 hr  Place 0.075 mg onto the skin 2 (two) times a week. Monday Thursday     Evening Primrose topical oil  Apply 1 application topically as needed.     multivitamin with minerals tablet  Take 1 tablet by mouth daily.     NATURE-THROID 97.5 MG Tabs  Generic drug:  Thyroid  Take 1 tablet by mouth every evening.     NATURE-THROID 113.75 MG Tabs  Generic drug:  Thyroid  Take 1 tablet by mouth every morning.     oxyCODONE-acetaminophen 5-325 MG per tablet  Commonly known as:  PERCOCET/ROXICET  Take 1-2 tablets by mouth every 4 (four) hours as needed  for moderate pain.     progesterone 200 MG capsule  Commonly known as:  PROMETRIUM  Take 200 mg by mouth daily.     sertraline 50 MG tablet  Commonly known as:  ZOLOFT  Take 50 mg by mouth daily.     VITAMIN K PO  Take 1 tablet by mouth daily.        Discharge Instructions:     Follow-up Information   Follow up with [facility] On 05/16/2014. (3:00pm, arrive at 2:30pm for paperwork)    Contact information:   [address]   [place] [address] [phone]       Signed: Letha CapeKelly E Filomena Pokorney 04/26/2014, 10:23 AM

## 2014-04-26 NOTE — Discharge Summary (Signed)
Discharge  Wilmon ArmsMatthew K. Corliss Skainssuei, MD, [facility]  General/ Trauma Surgery  04/26/2014 11:22 AM

## 2014-04-26 NOTE — Discharge Instructions (Signed)
[facility] ______[facility] °LAPAROSCOPIC SURGERY: POST OP INSTRUCTIONS °Always review your discharge instruction sheet given to you by the facility where your surgery was performed. °IF YOU HAVE DISABILITY OR FAMILY LEAVE FORMS, YOU MUST BRING THEM TO THE OFFICE FOR PROCESSING.   °DO NOT GIVE THEM TO YOUR DOCTOR. ° °1. A prescription for pain medication may be given to you upon discharge.  Take your pain medication as prescribed, if needed.  If narcotic pain medicine is not needed, then you may take acetaminophen (Tylenol) or ibuprofen (Advil) as needed. °2. Take your usually prescribed medications unless otherwise directed. °3. If you need a refill on your pain medication, please contact your pharmacy.  They will contact our office to request authorization. Prescriptions will not be filled after 5pm or on week-ends. °4. You should follow a light diet the first few days after arrival home, such as soup and crackers, etc.  Be sure to include lots of fluids daily. °5. Most patients will experience some swelling and bruising in the area of the incisions.  Ice packs will help.  Swelling and bruising can take several days to resolve.  °6. It is common to experience some constipation if taking pain medication after surgery.  Increasing fluid intake and taking a stool softener (such as Colace) will usually help or prevent this problem from occurring.  A mild laxative (Milk of Magnesia or Miralax) should be taken according to package instructions if there are no bowel movements after 48 hours. °7. Unless discharge instructions indicate otherwise, you may remove your bandages 24-48 hours after surgery, and you may shower at that time.  You may have steri-strips (small skin tapes) in place directly over the incision.  These strips should be left on the skin for 7-10 days.  If your surgeon used skin glue on the incision, you may shower in 24 hours.  The glue will flake off over the next 2-3 weeks.  Any sutures or  staples will be removed at the office during your follow-up visit. °8. ACTIVITIES:  You may resume regular (light) daily activities beginning the next day--such as daily self-care, walking, climbing stairs--gradually increasing activities as tolerated.  You may have sexual intercourse when it is comfortable.  Refrain from any heavy lifting or straining until approved by your doctor. °a. You may drive when you are no longer taking prescription pain medication, you can comfortably wear a seatbelt, and you can safely maneuver your car and apply brakes. °b. RETURN TO WORK:  __________________________________________________________ °9. You should see your doctor in the office for a follow-up appointment approximately 2-3 weeks after your surgery.  Make sure that you call for this appointment within a day or two after you arrive home to insure a convenient appointment time. °10. OTHER INSTRUCTIONS: __________________________________________________________________________________________________________________________ __________________________________________________________________________________________________________________________ °WHEN TO CALL YOUR DOCTOR: °1. Fever over 101.0 °2. Inability to urinate °3. Continued bleeding from incision. °4. Increased pain, redness, or drainage from the incision. °5. Increasing abdominal pain ° °The clinic staff is available to answer your questions during regular business hours.  Please don’t hesitate to call and ask to speak to one of the nurses for clinical concerns.  If you have a medical emergency, go to the nearest emergency room or call 911.  A surgeon from [facility] is always on call at the hospital. °[address], [address], [place], [place]  [address] ? [address] [address], [place], [place]   [address] °[phone] ? [phone] ? FAX [phone] °Web site:   [email or web address] °

## 2014-04-28 ENCOUNTER — Encounter (HOSPITAL_COMMUNITY): Payer: Self-pay | Admitting: General Surgery

## 2014-05-02 ENCOUNTER — Encounter (INDEPENDENT_AMBULATORY_CARE_PROVIDER_SITE_OTHER): Payer: Self-pay

## 2014-05-16 ENCOUNTER — Encounter (INDEPENDENT_AMBULATORY_CARE_PROVIDER_SITE_OTHER): Payer: Self-pay

## 2014-05-16 ENCOUNTER — Ambulatory Visit (INDEPENDENT_AMBULATORY_CARE_PROVIDER_SITE_OTHER): Payer: 59 | Admitting: General Surgery

## 2014-05-16 VITALS — BP 126/78 | HR 72 | Temp 98.0°F | Resp 14 | Ht 68.0 in | Wt 170.2 lb

## 2014-05-16 DIAGNOSIS — K3580 Unspecified acute appendicitis: Secondary | ICD-10-CM

## 2014-05-16 NOTE — Progress Notes (Signed)
Julia Murphy 1961/02/17 [id number] 05/16/2014   History of Present Illness:  Julia Murphy is a  54 y.o. female who presents today status post lap appy by Dr. Violeta Gelinas.  Pathology reveals: BP 126/78  Pulse 72  Temp(Src) 98 F (36.7 C) (Oral)  Resp 14  Ht 5\' 8"  (1.727 m)  Wt 77.202 kg (170 lb 3.2 oz)  BMI 25.88 kg/m2   Appendix, Other than Incidental - ACUTE FULL THICKNESS APPENDICITIS AND SEROSITIS WITH PERIAPPENDICEAL ABSCESS FORMATION. NO TUMOR SEEN. Specimen Gross and Clinical Information  The patient is tolerating a regular diet, having normal bowel movements, has good pain control.  She  is back to most normal activities.   Physical Exam: Abd: soft, nontender, active bowel sounds, nondistended.  All incisions are well healed.  Impression: 1.  Acute appendicitis, s/p lap appy  Plan: She  is able to return to normal activities. She  may follow up on a prn basis.

## 2014-05-16 NOTE — Patient Instructions (Signed)
Call if we can help. 

## 2014-07-27 ENCOUNTER — Emergency Department (HOSPITAL_COMMUNITY)
Admission: EM | Admit: 2014-07-27 | Discharge: 2014-07-27 | Disposition: A | Discharge status: Home / Self Care | Payer: 59 | Attending: Emergency Medicine | Admitting: Emergency Medicine

## 2014-07-27 ENCOUNTER — Emergency Department (HOSPITAL_COMMUNITY): Payer: 59

## 2014-07-27 ENCOUNTER — Encounter (HOSPITAL_COMMUNITY): Payer: Self-pay | Admitting: Emergency Medicine

## 2014-07-27 DIAGNOSIS — E079 Disorder of thyroid, unspecified: Secondary | ICD-10-CM | POA: Insufficient documentation

## 2014-07-27 DIAGNOSIS — Y9389 Activity, other specified: Secondary | ICD-10-CM | POA: Diagnosis not present

## 2014-07-27 DIAGNOSIS — Z79899 Other long term (current) drug therapy: Secondary | ICD-10-CM | POA: Insufficient documentation

## 2014-07-27 DIAGNOSIS — Y92410 Unspecified street and highway as the place of occurrence of the external cause: Secondary | ICD-10-CM | POA: Diagnosis not present

## 2014-07-27 DIAGNOSIS — M542 Cervicalgia: Secondary | ICD-10-CM

## 2014-07-27 DIAGNOSIS — S0993XA Unspecified injury of face, initial encounter: Secondary | ICD-10-CM | POA: Diagnosis not present

## 2014-07-27 DIAGNOSIS — S199XXA Unspecified injury of neck, initial encounter: Principal | ICD-10-CM

## 2014-07-27 MED ORDER — IBUPROFEN 400 MG PO TABS
800.0000 mg | ORAL_TABLET | Freq: Once | ORAL | Status: AC
  Administered 2014-07-27: 800 mg via ORAL
  Filled 2014-07-27: qty 2

## 2014-07-27 MED ORDER — CYCLOBENZAPRINE HCL 10 MG PO TABS
10.0000 mg | ORAL_TABLET | Freq: Once | ORAL | Status: AC
  Administered 2014-07-27: 10 mg via ORAL
  Filled 2014-07-27: qty 1

## 2014-07-27 MED ORDER — CYCLOBENZAPRINE HCL 10 MG PO TABS: 10.0000 mg | ORAL_TABLET | Freq: Two times a day (BID) | ORAL | Status: DC | PRN
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
## 2014-07-27 NOTE — Discharge Instructions (Signed)
Take Flexeril as needed for muscle spasm. Refer to attached documents for more information.  °
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
## 2014-08-01 NOTE — ED Provider Notes (Signed)
Medical screening examination/treatment/procedure(s) were performed by non-physician practitioner and as supervising physician I was immediately available for consultation/collaboration.   EKG Interpretation None       Aariyah Sampey, MD 08/01/14 1615 

## 2014-08-17 ENCOUNTER — Ambulatory Visit (INDEPENDENT_AMBULATORY_CARE_PROVIDER_SITE_OTHER): Payer: 59 | Admitting: Neurology

## 2014-08-17 ENCOUNTER — Encounter: Payer: Self-pay | Admitting: Neurology

## 2014-08-17 VITALS — BP 129/84 | HR 101 | Ht 68.0 in | Wt 165.0 lb

## 2014-08-17 DIAGNOSIS — R519 Headache, unspecified: Secondary | ICD-10-CM | POA: Insufficient documentation

## 2014-08-17 DIAGNOSIS — S139XXA Sprain of joints and ligaments of unspecified parts of neck, initial encounter: Secondary | ICD-10-CM

## 2014-08-17 DIAGNOSIS — S139XXS Sprain of joints and ligaments of unspecified parts of neck, sequela: Secondary | ICD-10-CM

## 2014-08-17 DIAGNOSIS — IMO0002 Reserved for concepts with insufficient information to code with codable children: Secondary | ICD-10-CM

## 2014-08-17 DIAGNOSIS — R51 Headache: Secondary | ICD-10-CM

## 2014-08-17 HISTORY — DX: Sprain of joints and ligaments of unspecified parts of neck, initial encounter: S13.9XXA
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
## 2014-08-17 NOTE — Patient Instructions (Signed)

## 2014-12-05 ENCOUNTER — Other Ambulatory Visit: Payer: Self-pay | Admitting: Physical Medicine and Rehabilitation

## 2014-12-05 ENCOUNTER — Ambulatory Visit
Admission: RE | Admit: 2014-12-05 | Discharge: 2014-12-05 | Disposition: A | Discharge status: Home / Self Care | Payer: 59 | Source: Ambulatory Visit | Attending: Physical Medicine and Rehabilitation | Admitting: Physical Medicine and Rehabilitation

## 2014-12-05 DIAGNOSIS — S134XXD Sprain of ligaments of cervical spine, subsequent encounter: Secondary | ICD-10-CM

## 2014-12-06 ENCOUNTER — Other Ambulatory Visit: Payer: Self-pay | Admitting: Physical Medicine and Rehabilitation

## 2014-12-06 DIAGNOSIS — E041 Nontoxic single thyroid nodule: Secondary | ICD-10-CM

## 2014-12-21 ENCOUNTER — Ambulatory Visit
Admission: RE | Admit: 2014-12-21 | Discharge: 2014-12-21 | Disposition: A | Discharge status: Home / Self Care | Payer: 59 | Source: Ambulatory Visit | Attending: Physical Medicine and Rehabilitation | Admitting: Physical Medicine and Rehabilitation

## 2014-12-21 DIAGNOSIS — E041 Nontoxic single thyroid nodule: Secondary | ICD-10-CM

## 2015-01-12 ENCOUNTER — Ambulatory Visit (INDEPENDENT_AMBULATORY_CARE_PROVIDER_SITE_OTHER): Payer: Self-pay | Admitting: Surgery

## 2015-01-12 ENCOUNTER — Other Ambulatory Visit (INDEPENDENT_AMBULATORY_CARE_PROVIDER_SITE_OTHER): Payer: Self-pay | Admitting: Surgery

## 2015-01-12 DIAGNOSIS — E041 Nontoxic single thyroid nodule: Secondary | ICD-10-CM

## 2015-02-01 ENCOUNTER — Other Ambulatory Visit (HOSPITAL_COMMUNITY)
Admission: RE | Admit: 2015-02-01 | Discharge: 2015-02-01 | Disposition: A | Discharge status: Home / Self Care | Payer: 59 | Source: Ambulatory Visit | Attending: Interventional Radiology | Admitting: Interventional Radiology

## 2015-02-01 ENCOUNTER — Ambulatory Visit
Admission: RE | Admit: 2015-02-01 | Discharge: 2015-02-01 | Disposition: A | Discharge status: Home / Self Care | Payer: 59 | Source: Ambulatory Visit | Attending: Surgery | Admitting: Surgery

## 2015-02-01 DIAGNOSIS — E041 Nontoxic single thyroid nodule: Secondary | ICD-10-CM | POA: Diagnosis present

## 2015-02-26 ENCOUNTER — Other Ambulatory Visit: Payer: Self-pay | Admitting: *Deleted

## 2015-02-26 ENCOUNTER — Other Ambulatory Visit: Payer: Self-pay | Admitting: Surgery

## 2015-02-27 ENCOUNTER — Other Ambulatory Visit: Payer: Self-pay | Admitting: *Deleted

## 2015-02-27 DIAGNOSIS — E041 Nontoxic single thyroid nodule: Secondary | ICD-10-CM

## 2015-03-08 ENCOUNTER — Other Ambulatory Visit (HOSPITAL_COMMUNITY)
Admission: RE | Admit: 2015-03-08 | Discharge: 2015-03-08 | Disposition: A | Discharge status: Home / Self Care | Payer: 59 | Source: Ambulatory Visit | Attending: Interventional Radiology | Admitting: Interventional Radiology

## 2015-03-08 ENCOUNTER — Ambulatory Visit
Admission: RE | Admit: 2015-03-08 | Discharge: 2015-03-08 | Disposition: A | Discharge status: Home / Self Care | Payer: 59 | Source: Ambulatory Visit | Attending: Surgery | Admitting: Surgery

## 2015-03-08 DIAGNOSIS — E041 Nontoxic single thyroid nodule: Secondary | ICD-10-CM | POA: Insufficient documentation

## 2015-03-26 ENCOUNTER — Encounter (HOSPITAL_COMMUNITY): Payer: Self-pay

## 2016-01-01 ENCOUNTER — Encounter: Payer: Self-pay | Admitting: Women's Health

## 2016-01-01 ENCOUNTER — Ambulatory Visit (INDEPENDENT_AMBULATORY_CARE_PROVIDER_SITE_OTHER): Payer: 59 | Admitting: Women's Health

## 2016-01-01 ENCOUNTER — Other Ambulatory Visit (HOSPITAL_COMMUNITY)
Admission: RE | Admit: 2016-01-01 | Discharge: 2016-01-01 | Disposition: A | Discharge status: Home / Self Care | Payer: 59 | Source: Ambulatory Visit | Attending: Women's Health | Admitting: Women's Health

## 2016-01-01 VITALS — BP 124/80 | Ht 68.0 in | Wt 174.0 lb

## 2016-01-01 DIAGNOSIS — Z1151 Encounter for screening for human papillomavirus (HPV): Secondary | ICD-10-CM | POA: Insufficient documentation

## 2016-01-01 DIAGNOSIS — Z13820 Encounter for screening for osteoporosis: Secondary | ICD-10-CM | POA: Diagnosis not present

## 2016-01-01 DIAGNOSIS — Z01419 Encounter for gynecological examination (general) (routine) without abnormal findings: Secondary | ICD-10-CM | POA: Insufficient documentation

## 2016-01-01 NOTE — Progress Notes (Signed)
ALEEA HENDRY [birth year] [id number]    History:    Presents for annual exam.  Postmenopausal on bioidentical hormones per naturalist. No bleeding. Also on bioidentical thyroid support hormones. 01/2015 thyroid biopsy atypical cells had follow-up at [facility] with normal biopsy results. Has had normal annual mammograms last one December 2015 at [place]. Negative colonoscopy approximate 5 years ago. Normal Pap history. Primary care manages labs  Past medical history, past surgical history, family history and social history were all reviewed and documented in the EPIC chart. Occupational therapist mostly working with elderly. Moved here from [place]. Daughter 18, planning to go to [facility] state or [place], son  15 doing well in school both have received gardasil. Appendectomy 2015. Parents deceased, father CHF, mother questionable sepsis and stroke.  ROS:  A ROS was performed and pertinent positives and negatives are included.  Exam:  Filed Vitals:   01/01/16 1526  BP: 124/80    General appearance:  Normal Thyroid:  Symmetrical, normal in size, without palpable masses or nodularity. Respiratory  Auscultation:  Clear without wheezing or rhonchi Cardiovascular  Auscultation:  Regular rate, without rubs, murmurs or gallops  Edema/varicosities:  Not grossly evident Abdominal  Soft,nontender, without masses, guarding or rebound.  Liver/spleen:  No organomegaly noted  Hernia:  None appreciated  Skin  Inspection:  Grossly normal   Breasts: Examined lying and sitting.     Right: Without masses, retractions, discharge or axillary adenopathy.     Left: Without masses, retractions, discharge or axillary adenopathy. Gentitourinary   Inguinal/mons:  Normal without inguinal adenopathy  External genitalia:  Normal  BUS/Urethra/Skene's glands:  Normal  Vagina:  Normal  Cervix:  Normal  Uterus:   normal in size, shape and contour.  Midline and mobile  Adnexa/parametria:     Rt: Without  masses or tenderness.   Lt: Without masses or tenderness.  Anus and perineum: Normal  Digital rectal exam: Normal sphincter tone without palpated masses or tenderness  Assessment/Plan:  55 y.o. MWF G2 P2  for annual exam no complaints.  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 SBE's, exercise, calcium rich diet, vitamin D as recommended by primary care. Reviewed importance of regular exercise and low carb diet.  DEXA will schedule. Pap with HR HPV typing. HRT reviewed risks of blood clots, strokes and breast cancer, reviewed best to be on shortest length of time, reviewed bioidentical hormones are still hormones, prefers to continue.    Harrington Challenger [facility], 4:55 PM 01/01/2016

## 2016-01-01 NOTE — Patient Instructions (Signed)

## 2016-01-04 LAB — CYTOLOGY - PAP

## 2016-01-25 ENCOUNTER — Encounter: Payer: Self-pay | Admitting: Women's Health

## 2016-01-31 ENCOUNTER — Encounter: Payer: Self-pay | Admitting: Women's Health

## 2016-02-18 ENCOUNTER — Ambulatory Visit (INDEPENDENT_AMBULATORY_CARE_PROVIDER_SITE_OTHER): Payer: 59

## 2016-02-18 DIAGNOSIS — Z13820 Encounter for screening for osteoporosis: Secondary | ICD-10-CM

## 2017-01-01 ENCOUNTER — Ambulatory Visit (INDEPENDENT_AMBULATORY_CARE_PROVIDER_SITE_OTHER): Payer: 59 | Admitting: Women's Health

## 2017-01-01 ENCOUNTER — Encounter: Payer: Self-pay | Admitting: Women's Health

## 2017-01-01 VITALS — BP 126/82 | Ht 67.75 in | Wt 170.0 lb

## 2017-01-01 DIAGNOSIS — Z23 Encounter for immunization: Secondary | ICD-10-CM

## 2017-01-01 DIAGNOSIS — Z01419 Encounter for gynecological examination (general) (routine) without abnormal findings: Secondary | ICD-10-CM | POA: Diagnosis not present

## 2017-01-01 NOTE — Patient Instructions (Signed)

## 2017-01-01 NOTE — Progress Notes (Signed)
Julia Murphy 01-29-1961 [id number]    History:    Presents for annual exam.  Postmenopausal on HRT per naturalist with no bleeding. On estradiol patch and by mouth progesterone. Normal Pap and mammogram history. Normal colonoscopy at age 89. 02/2016 normal DEXA hip average -0.3.  Past medical history, past surgical history, family history and social history were all reviewed and documented in the EPIC chart. 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 Daughter at [employer], son [id number] both doing well and have received gardasil. Father congestive heart failure, mother stroke deceased.  ROS:  A ROS was performed and pertinent positives and negatives are included.  Exam:  Vitals:   01/01/17 1525  BP: 126/82  Weight: 170 lb (77.1 kg)  Height: 5' 7.75" (1.721 m)   Body mass index is 26.04 kg/m.   General appearance:  Normal Thyroid:  Symmetrical, normal in size, without palpable masses or nodularity. Respiratory  Auscultation:  Clear without wheezing or rhonchi Cardiovascular  Auscultation:  Regular rate, without rubs, murmurs or gallops  Edema/varicosities:  Not grossly evident Abdominal  Soft,nontender, without masses, guarding or rebound.  Liver/spleen:  No organomegaly noted  Hernia:  None appreciated  Skin  Inspection:  Grossly normal   Breasts: Examined lying and sitting.     Right: Without masses, retractions, discharge or axillary adenopathy.     Left: Without masses, retractions, discharge or axillary adenopathy. Gentitourinary   Inguinal/mons:  Normal without inguinal adenopathy  External genitalia:  Normal  BUS/Urethra/Skene's glands:  Normal  Vagina:  Normal  Cervix:  Normal  Uterus:  normal in size, shape and contour.  Midline and mobile  Adnexa/parametria:     Rt: Without masses or tenderness.   Lt: Without masses or tenderness.  Anus and perineum: Normal  Digital rectal exam: Normal sphincter tone without palpated masses or  tenderness  Assessment/Plan:  56 y.o. MWF G2 P2 for annual exam with no complaints.  Postmenopausal on bioidentical hormones from naturalist with no bleeding Anxiety/depression on Zoloft per primary care/Labs  Plan: SBE's, continue annual screening mammogram, calcium rich diet, vitamin D 1000 daily encouraged. Instructed to have vitamin D level checked at next lab draw. Home safety, fall prevention and importance of weightbearing exercise reviewed. HRT risk of blood clots, strokes and breast cancer reviewed, reviewed best to stay on less than 7 years woman health initiative reviewed. Reviewed bioidentical hormones are still hormones. Pap 1/17 normal, new screening guidelines reviewed.    Harrington ChallengerYOUNG,Ellanie Oppedisano [facility] HsptlWHNP, 5:24 PM 01/01/2017

## 2017-01-02 LAB — URINALYSIS W MICROSCOPIC + REFLEX CULTURE
Bacteria, UA: NONE SEEN [HPF]
Bilirubin Urine: NEGATIVE
Casts: NONE SEEN [LPF]
Crystals: NONE SEEN [HPF]
Glucose, UA: NEGATIVE
Ketones, ur: NEGATIVE
Leukocytes, UA: NEGATIVE
Nitrite: NEGATIVE
Protein, ur: NEGATIVE
RBC / HPF: NONE SEEN RBC/HPF
Specific Gravity, Urine: 1.011 (ref 1.001–1.035)
Squamous Epithelial / HPF: NONE SEEN [HPF]
WBC, UA: NONE SEEN WBC/HPF
Yeast: NONE SEEN [HPF]
pH: 5.5 (ref 5.0–8.0)

## 2017-01-22 IMAGING — US US THYROID BIOPSY
1 series · 13 of 13 positions shown · non-contrast
Comparison: None.

CLINICAL DATA: Solitary left lobe nodule

EXAM:
ULTRASOUND GUIDED NEEDLE ASPIRATE BIOPSY OF THE THYROID GLAND

[Series 1: us thyroid biopsy · 0.05mm/px · 13 acquisitions, 13 frames shown]
[im 1/13]
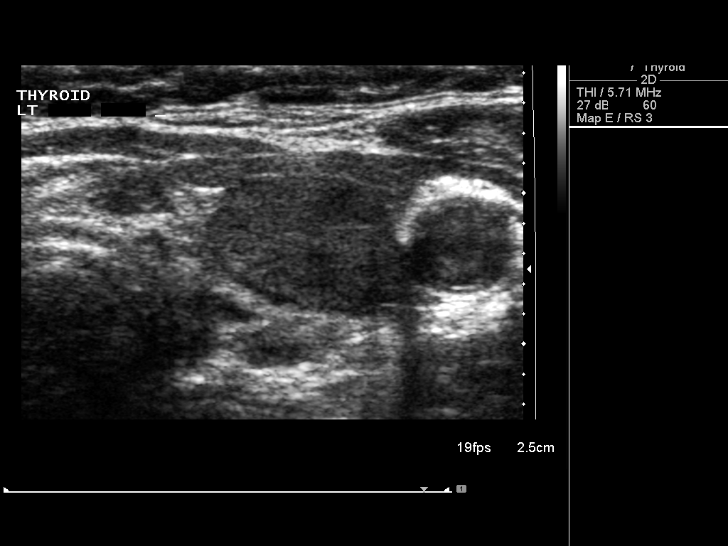
[im 2/13]
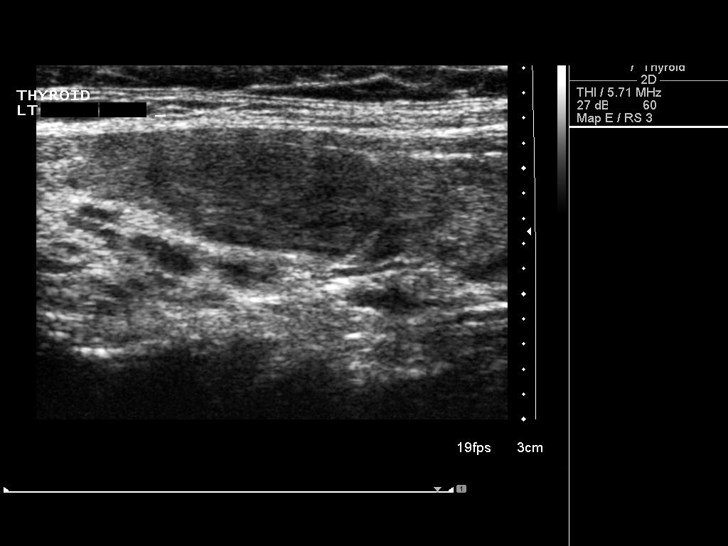
[im 3/13]
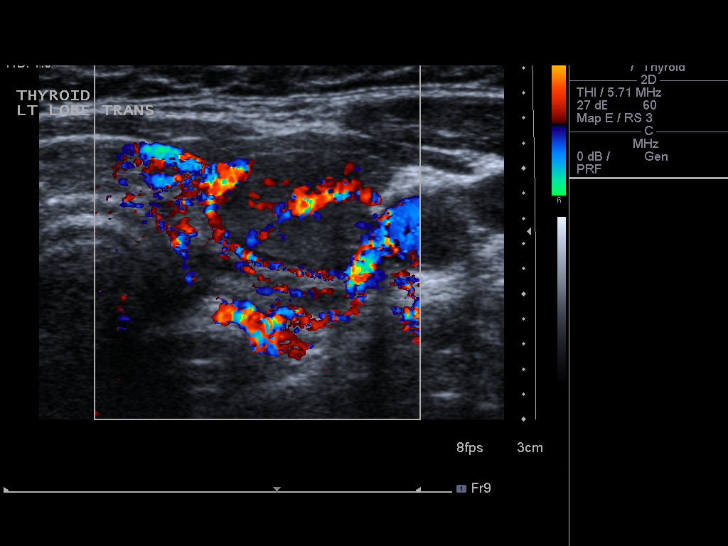
[im 4/13]
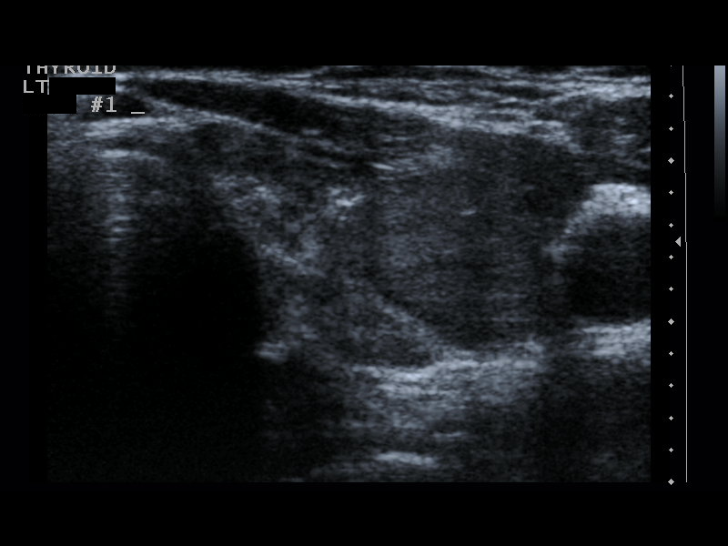
[im 5/13]
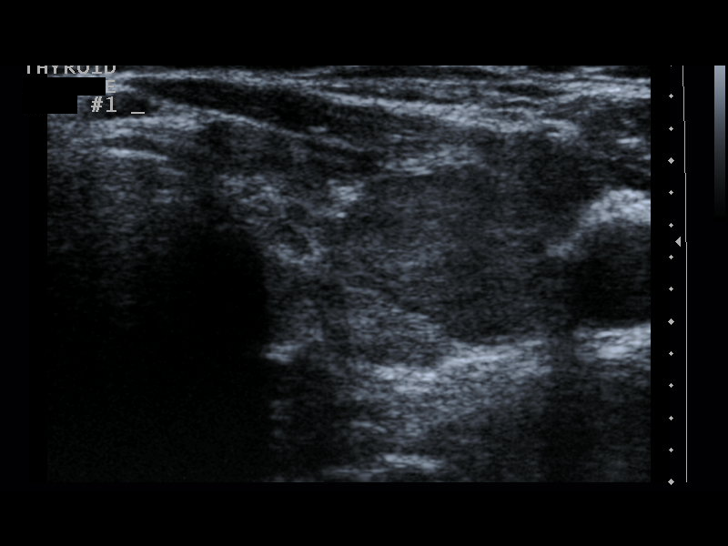
[im 6/13]
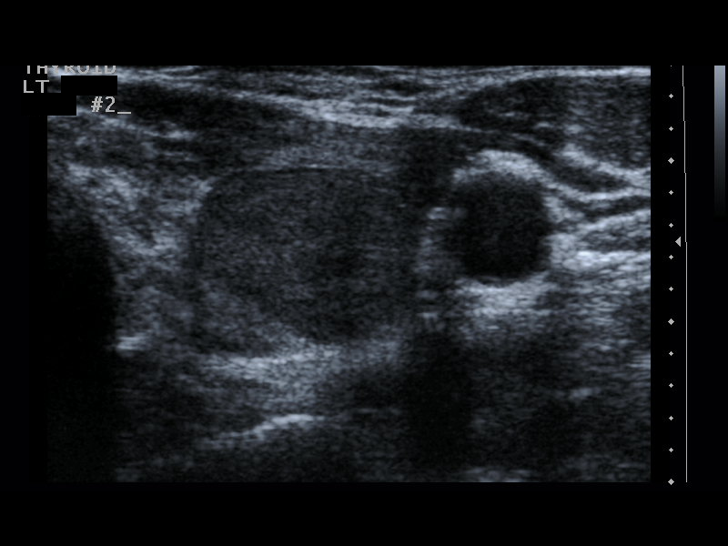
[im 7/13]
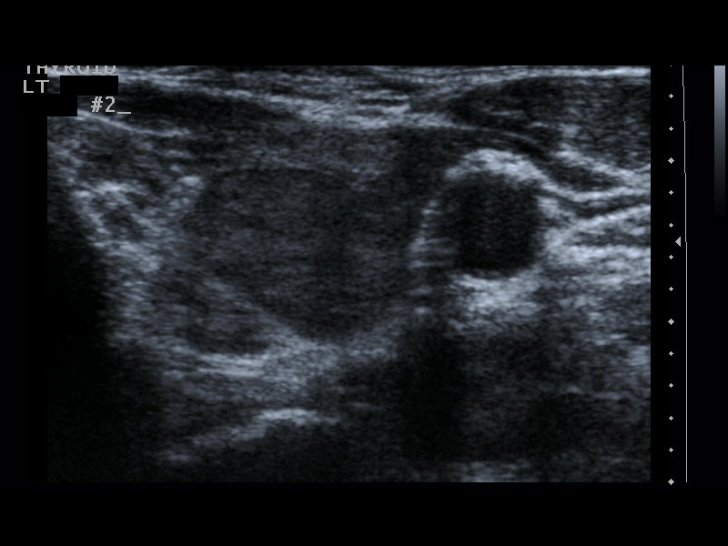
[im 8/13]
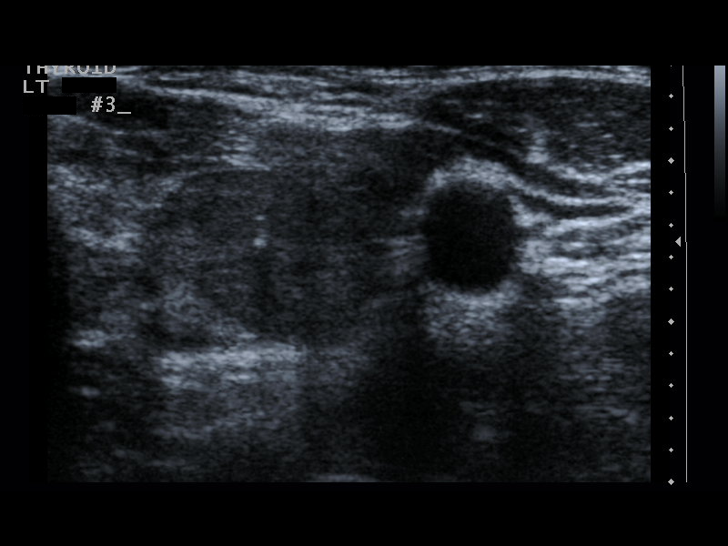
[im 9/13]
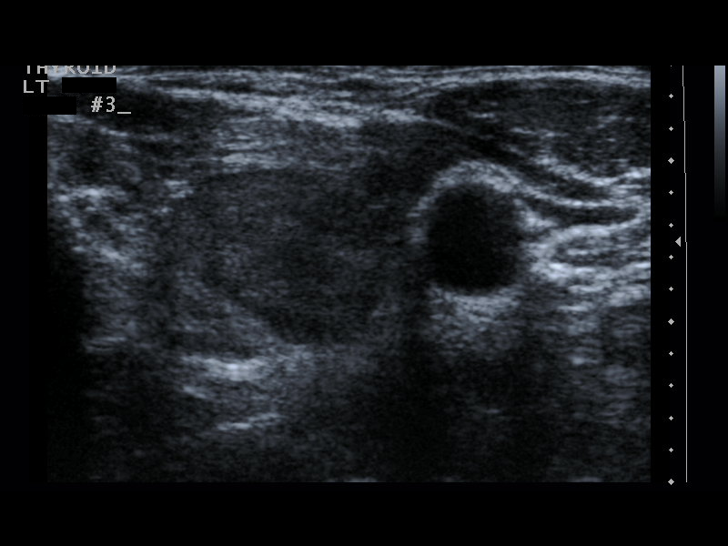
[im 10/13]
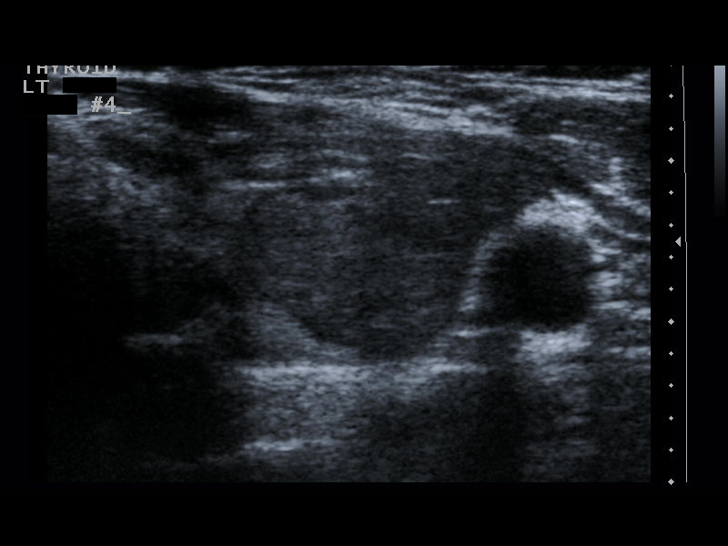
[im 11/13]
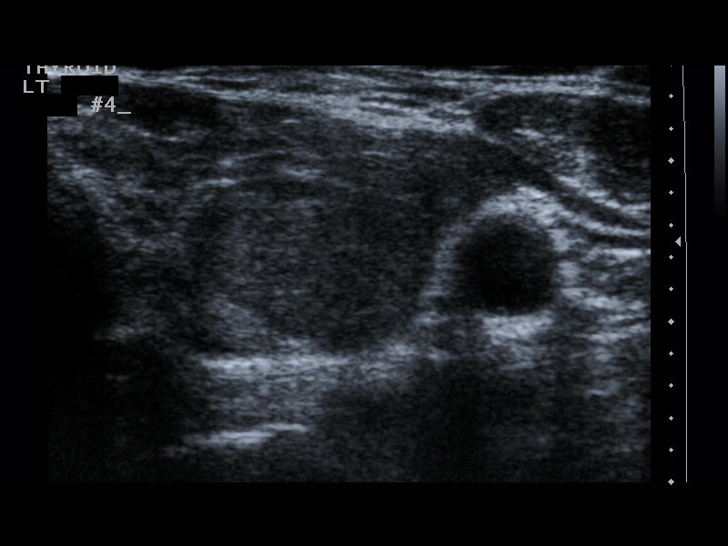
[im 12/13]
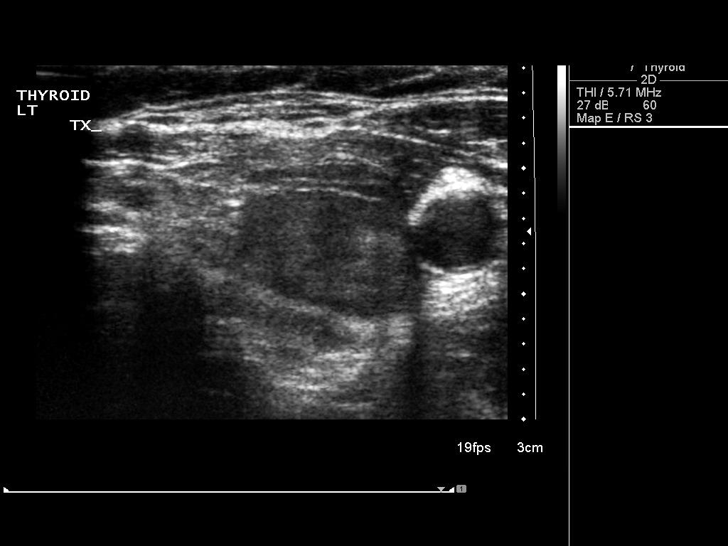
[im 13/13]
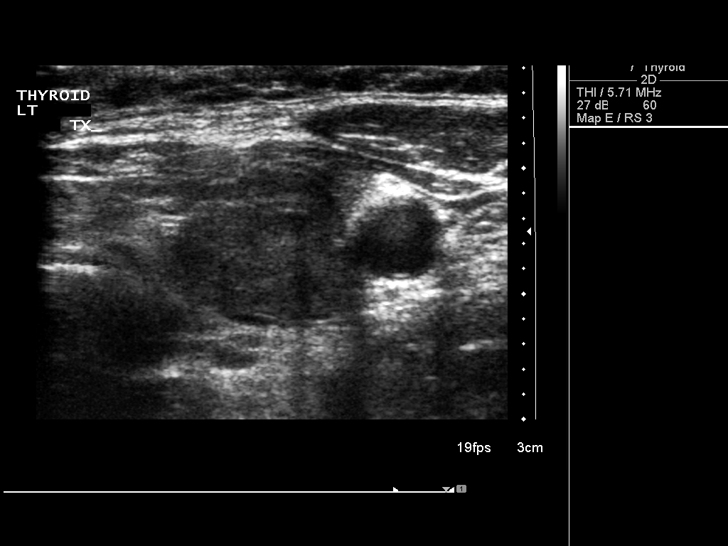

[13 of 13 positions shown; findings below may reference images not displayed]

PROCEDURE:
Thyroid biopsy was thoroughly discussed with the patient and
questions were answered. The benefits, risks, alternatives, and
complications were also discussed. The patient understands and
wishes to proceed with the procedure. Written consent was obtained.

Ultrasound was performed to localize and mark an adequate site for
the biopsy. The patient was then prepped and draped in a normal
sterile fashion. Local anesthesia was provided with 1% lidocaine.
Using direct ultrasound guidance, 3 passes were made using needles
into the nodule within the left lobe of the thyroid. Ultrasound was
used to confirm needle placements on all occasions. Specimens were
sent to Pathology for analysis.

COMPLICATIONS:
None.
FINDINGS: Images document needle placement in the solitary left lobe nodule.
IMPRESSION: Ultrasound guided needle aspirate biopsy performed of the left
thyroid nodule.

## 2017-02-03 ENCOUNTER — Encounter: Payer: Self-pay | Admitting: Women's Health

## 2017-02-26 IMAGING — US US THYROID BIOPSY
1 series · 13 of 17 positions shown · non-contrast
Comparison: Thyroid ultrasound 12/21/2014;

CLINICAL DATA: 54-year-old female with left thyroid nodule. Prior
thyroid biopsy (02/01/2015) demonstrated an indeterminate ([REDACTED]
category 3) nodule. Repeat biopsy with collection for Afirma testing
is warranted.

EXAM:
ULTRASOUND GUIDED NEEDLE ASPIRATE BIOPSY OF THE THYROID GLAND

[Series 1: us thyroid biopsy · 0.06mm/px · 17 acquisitions, 13 frames shown]
[im 1/17]
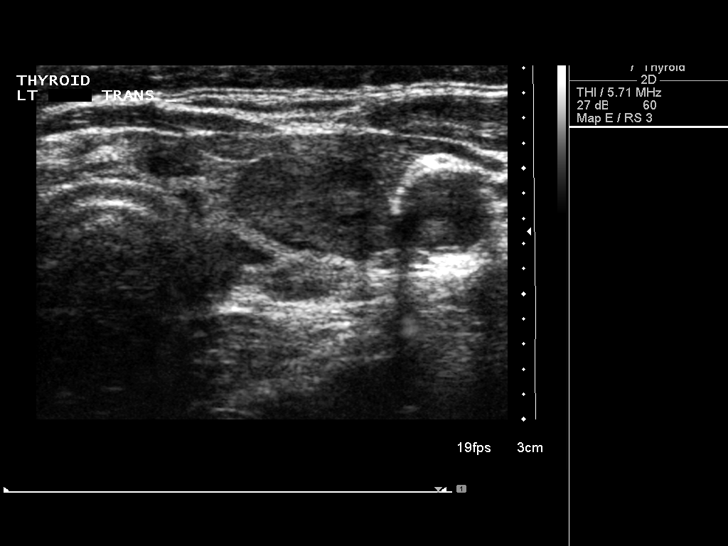
[im 2/17]
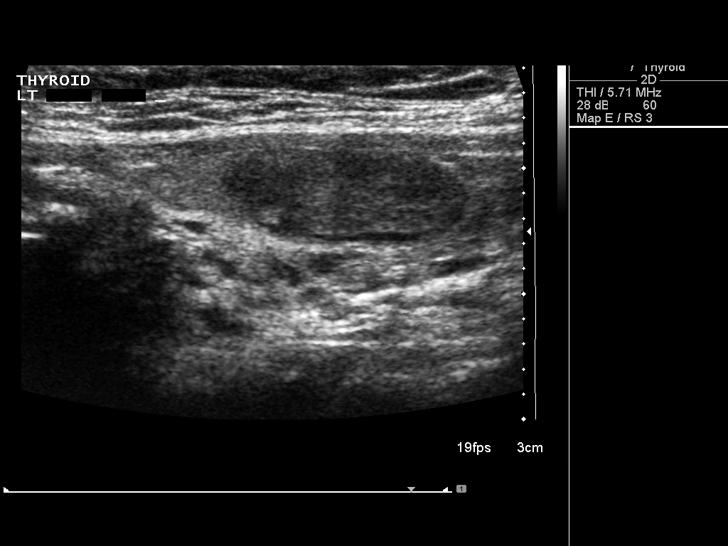
[im 4/17]
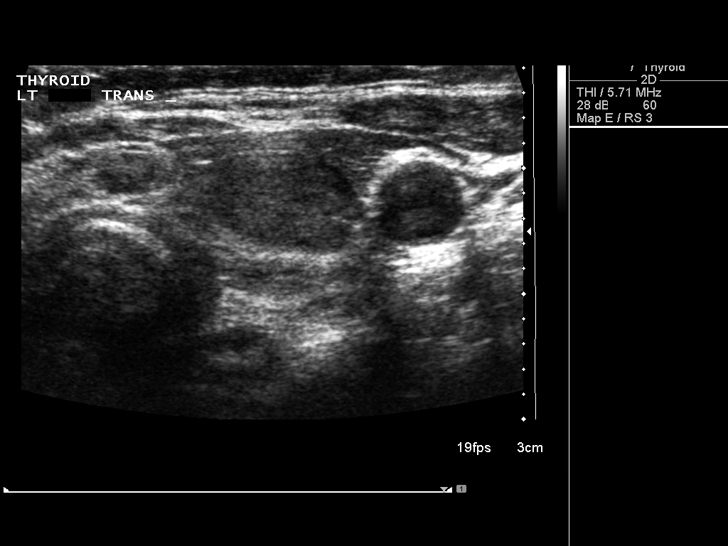
[im 5/17]
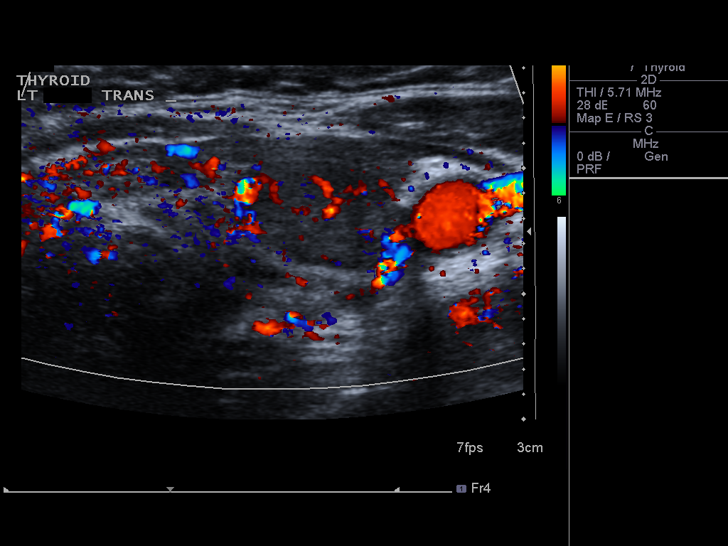
[im 6/17]
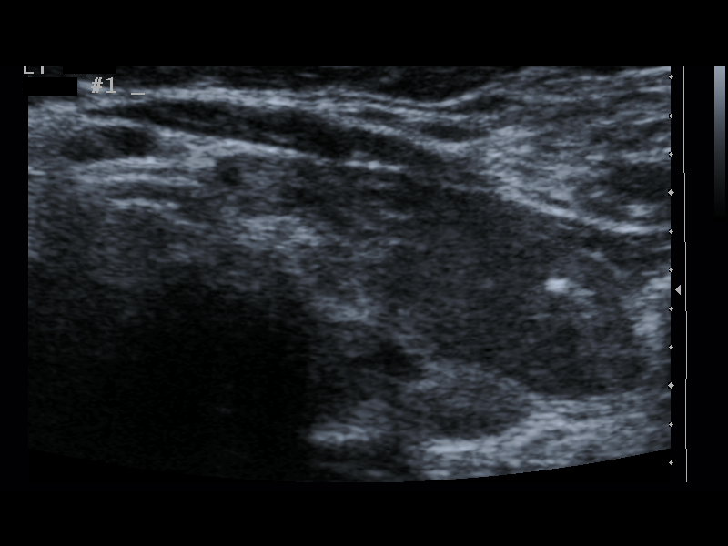
[im 8/17]
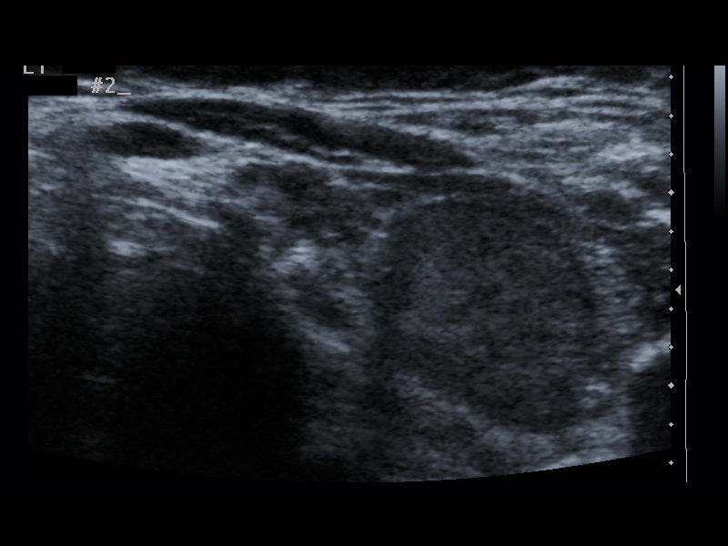
[im 9/17]
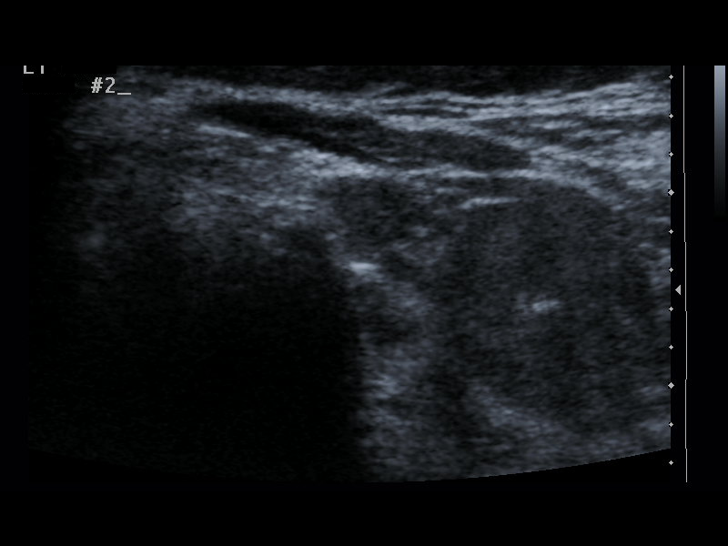
[im 10/17]
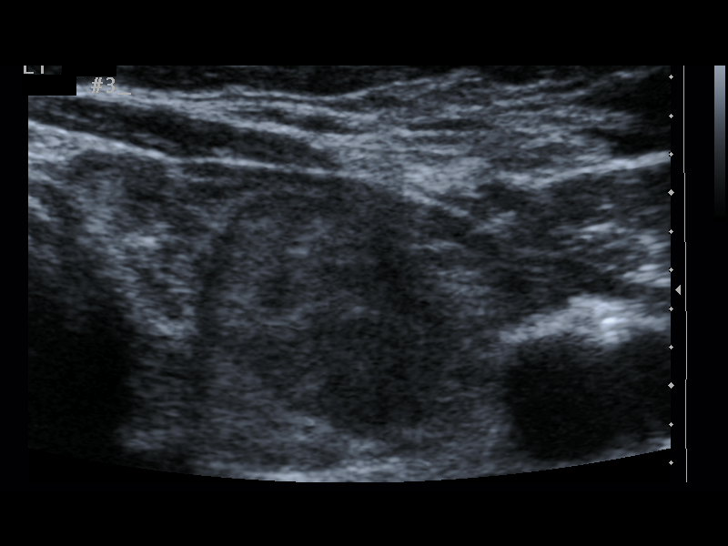
[im 12/17]
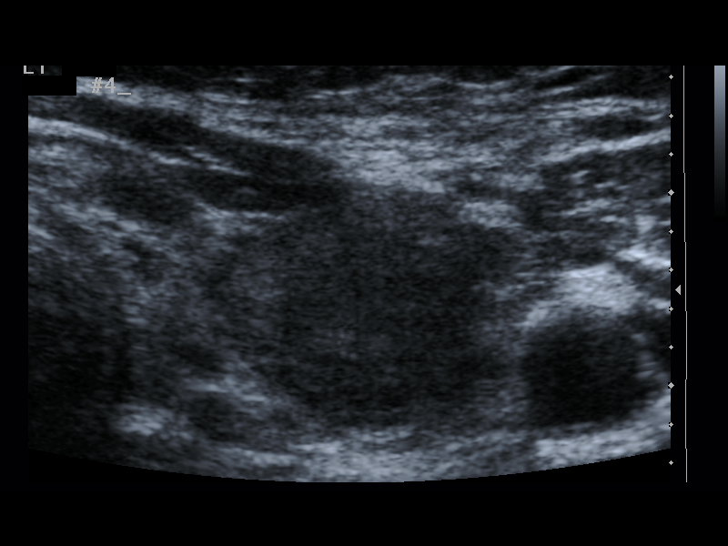
[im 13/17]
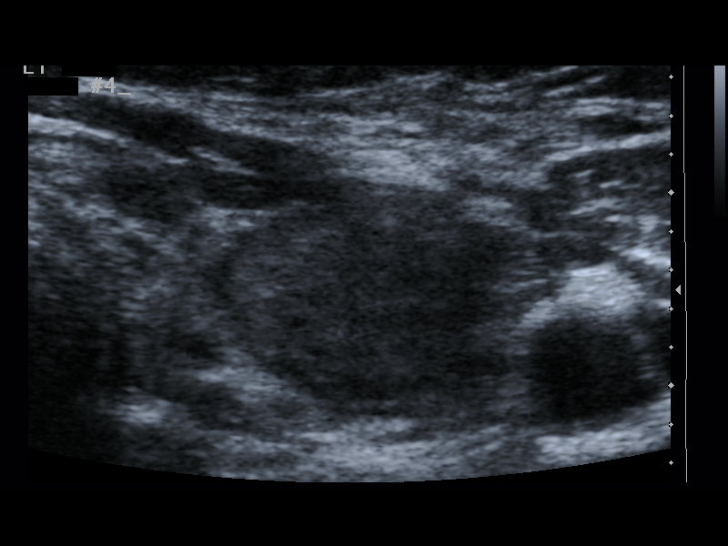
[im 14/17]
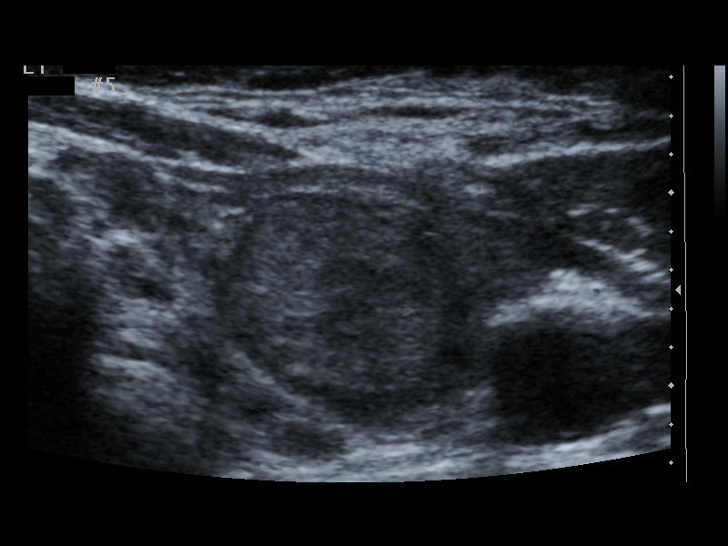
[im 16/17]
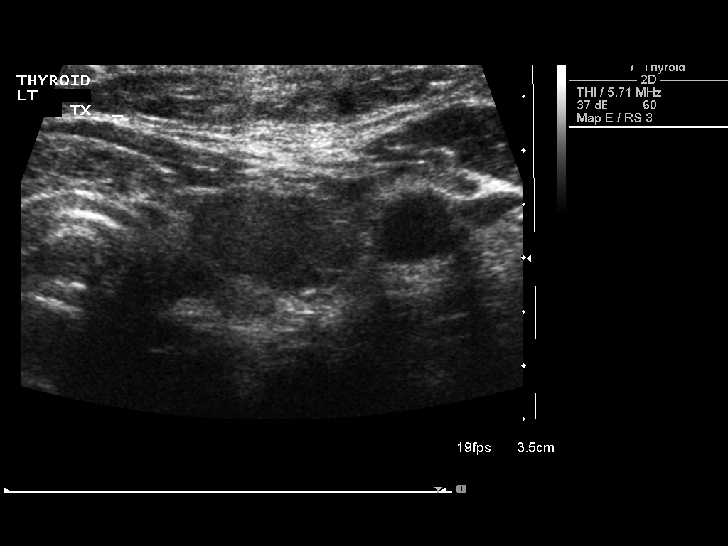
[im 17/17]
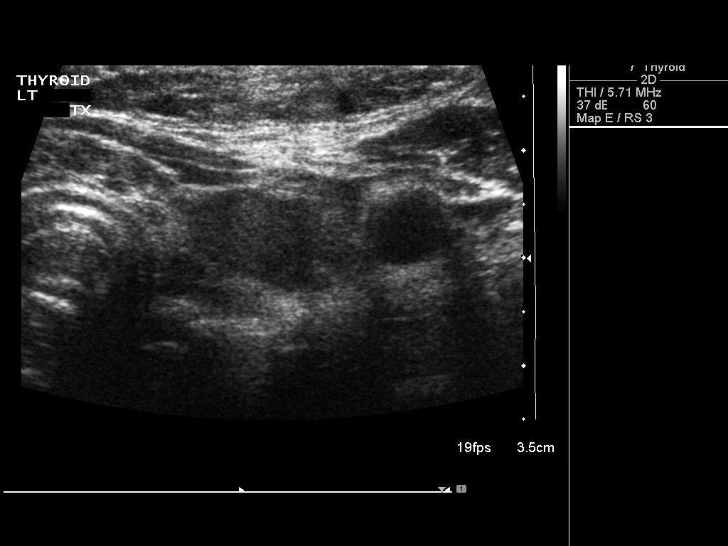

[13 of 17 positions shown; findings below may reference images not displayed]

prior thyroid biopsy
02/01/2015

PROCEDURE:
Thyroid biopsy was thoroughly discussed with the patient and
questions were answered. The benefits, risks, alternatives, and
complications were also discussed. The patient understands and
wishes to proceed with the procedure. Written consent was obtained.

Ultrasound was performed to localize and mark an adequate site for
the biopsy. The patient was then prepped and draped in a normal
sterile fashion. Local anesthesia was provided with 1% lidocaine.
Using direct ultrasound guidance, 2 passes were made using needles
into the nodule within the left lobe of the thyroid. The initial 2
passes were placed on slides for pathologic evaluation. An
additional 2 passes were then performed under sonographic guidance
and these samples were the set aside for a firm a testing. A final,
fifth pass was then obtained an added to a third slide for
pathologic evaluation. Ultrasound was used to confirm needle
placements on all occasions. Specimens were sent to Pathology for
analysis.

COMPLICATIONS:
None immediate
IMPRESSION: Ultrasound guided needle aspirate biopsy performed of the left
thyroid nodule.

Additional samples obtained for Afirma testing.

## 2019-03-28 ENCOUNTER — Other Ambulatory Visit: Payer: Self-pay

## 2019-03-29 ENCOUNTER — Encounter: Payer: Self-pay | Admitting: Women's Health

## 2019-03-29 ENCOUNTER — Ambulatory Visit: Payer: 59 | Admitting: Women's Health

## 2019-03-29 VITALS — BP 124/86

## 2019-03-29 DIAGNOSIS — N841 Polyp of cervix uteri: Secondary | ICD-10-CM | POA: Diagnosis not present

## 2019-03-29 DIAGNOSIS — N9489 Other specified conditions associated with female genital organs and menstrual cycle: Secondary | ICD-10-CM

## 2019-03-29 DIAGNOSIS — R102 Pelvic and perineal pain: Secondary | ICD-10-CM

## 2019-03-29 DIAGNOSIS — N949 Unspecified condition associated with female genital organs and menstrual cycle: Secondary | ICD-10-CM | POA: Diagnosis not present

## 2019-03-29 LAB — WET PREP FOR TRICH, YEAST, CLUE

## 2019-03-29 MED ORDER — FLUCONAZOLE 150 MG PO TABS: 150.0000 mg | ORAL_TABLET | Freq: Once | ORAL | 0 refills | Status: AC

## 2019-03-29 NOTE — Patient Instructions (Signed)
If cramping persists sonohysterogram with Dr Audie BoxFontaine   Sonohysterogram  A sonohysterogram is a procedure to examine the inside of the uterus. This exam uses sound waves that are sent to a computer to make images of the lining of the uterus (endometrium). To get the best images, a germ-free, salt-water solution (sterile saline) is put into the uterus through the vagina. You may have this procedure if you have certain reproductive problems, such as abnormal bleeding, infertility, or miscarriage. This procedure can show what may be causing these problems. Possible causes include scarring or abnormal growths such as fibroids inside your uterus. It can also show if your uterus is an abnormal shape or if the lining of the uterus is too thin. Tell a health care provider about:  All medicines you are taking, including vitamins, herbs, eye drops, creams, and over-the-counter medicines.  Any allergies you have.  Any blood disorders you have.  Any surgeries you have had.  Any medical conditions you have.  Whether you are pregnant or may be pregnant.  The date of the first day of your last period.  Any signs of infection, such as fever, pain in your lower abdomen, or abnormal discharge from your vagina. What are the risks? Generally, this is a safe procedure. However, problems may occur, including:  Abdominal pain or cramping.  Light bleeding (spotting).  Increased vaginal discharge.  Infection. What happens before the procedure?  Your health care provider may have you take an over-the-counter pain medicine.  You may be given medicine to stop any abnormal bleeding.  You may be given antibiotic medicine to help prevent infection.  You may be asked to take a pregnancy test. This is usually in the form of a urine test.  You may have a pelvic exam.  You will be asked to empty your bladder. What happens during the procedure?  You will lie down on the exam table with your feet in  stirrups or with your knees bent and your feet flat on the table.  A slender, handheld device (transducer) will be lubricated and placed into your vagina.  The transducer will be positioned to send sound waves to your uterus. The sound waves are sent to a computer and are turned into images, which your health care provider sees during the procedure.  The transducer will be removed from your vagina.  An instrument will be inserted to widen the opening of your vagina (speculum).  A swab with germ-killing solution (antiseptic) will be used to clean the opening to your uterus (cervix).  A long, thin tube (catheter) will be placed through your cervix into your uterus.  The speculum will be removed.  The transducer will be placed back into your vagina to take more images.  Your uterus will be filled with a germ-free, salt-water solution (sterile saline) through the catheter. You may feel some cramping.  A fluid that contains air bubbles may be sent through the catheter to make it easier to see the fallopian tubes.  The transducer and catheter will be removed. The procedure may vary among health care providers and hospitals. What happens after the procedure?  It is up to you to get the results of your procedure. Ask your health care provider, or the department that is doing the procedure, when your results will be ready. Summary  A sonohysterogram is a procedure that creates images of the inside of the uterus.  The risks of this procedure are very low. Most women experience cramping and spotting  after the procedure.  You may need to have a pelvic exam and take a pregnancy test before this procedure. This procedure will not be done if you are pregnant or have an infection. This information is not intended to replace advice given to you by your health care provider. Make sure you discuss any questions you have with your health care provider. Document Released: 04/10/2014 Document Revised:  10/20/2016 Document Reviewed: 10/20/2016 Elsevier Interactive Patient Education  2019 [employer].

## 2019-03-29 NOTE — Progress Notes (Signed)
58 year old MWF G2 P2 presents with complaint of lower abdominal cramping/pressure sensation, vaginal burning for the past week or so.  Denies vaginal odor, itching, urinary symptoms, reports negative UA at primary care.  Medical problems include hypothyroidism, diabetes per primary care and is on HRT  per naturalist.  On daily Prometrium and estrogen, testosterone pellets with no bleeding.  States had scant bright red blood blood after straining with bowel movement x1, none since.  Exam: Appears well.  No CVAT.  Abdomen soft, slight tenderness lower left quadrant, external genitalia within normal limits, speculum exam scant white discharge, wet prep positive for yeast, 2 cm endometrial polyp removed with ease sent for pathology.  Questionable stalk inside cervix.  Bimanual no CMT or adnexal tenderness.  Yeast vaginitis Endocervical polyp Pelvic cramping  Plan: Diflucan 150 p.o. x1 dose, yeast prevention discussed.  Instructed to call if vaginal burning persists.  If cramping and pressure sensation continue schedule sonohysterogram with Dr. Audie Box, (order in chart).  Reviewed possible endometrial polyps.  Endocervical polyp biopsy pending, reviewed most likely benign.  Schedule annual exam.

## 2019-03-31 LAB — PATHOLOGY REPORT

## 2019-03-31 LAB — TISSUE SPECIMEN

## 2019-04-04 ENCOUNTER — Other Ambulatory Visit: Payer: Self-pay | Admitting: Women's Health

## 2019-04-04 DIAGNOSIS — N9489 Other specified conditions associated with female genital organs and menstrual cycle: Secondary | ICD-10-CM

## 2019-04-19 ENCOUNTER — Other Ambulatory Visit: Payer: Self-pay

## 2019-04-21 ENCOUNTER — Other Ambulatory Visit: Payer: Self-pay | Admitting: Gynecology

## 2019-04-21 ENCOUNTER — Ambulatory Visit (INDEPENDENT_AMBULATORY_CARE_PROVIDER_SITE_OTHER): Payer: 59

## 2019-04-21 ENCOUNTER — Other Ambulatory Visit: Payer: Self-pay

## 2019-04-21 ENCOUNTER — Encounter: Payer: Self-pay | Admitting: Gynecology

## 2019-04-21 ENCOUNTER — Ambulatory Visit: Payer: 59 | Admitting: Gynecology

## 2019-04-21 VITALS — BP 122/76

## 2019-04-21 DIAGNOSIS — R102 Pelvic and perineal pain: Secondary | ICD-10-CM

## 2019-04-21 DIAGNOSIS — N841 Polyp of cervix uteri: Secondary | ICD-10-CM

## 2019-04-21 NOTE — Progress Notes (Signed)
    Julia Murphy 04/15/1961 [id number]        58 y.o.  G2P0000 presents for sonohysterogram.  Julia Murphy with a history of some uterine cramping no bleeding.  And endocervical polyp was noted and removed.  Pathology showed a benign endocervical polyp in the both he and cysts.  Patient notes that her cramping has resolved.  Sonohysterogram was ordered to rule out endometrial polyps.  Past medical history,surgical history, problem list, medications, allergies, family history and social history were all reviewed and documented in the EPIC chart.  Directed ROS with pertinent positives and negatives documented in the history of present illness/assessment and plan.  Exam: Jasmine December assistant BP 122/76 General appearance:  Normal Pelvic external BUS vagina normal with atrophic changes.  Cervix with atrophic changes.  Uterus grossly normal size midline mobile nontender.  Adnexa without masses or tenderness.  Ultrasound transvaginal shows uterus retroverted, normal size with no myometrial abnormalities.  Endometrial echo 2.5 mm.  Right and left ovaries visualized and atrophic.  Cul-de-sac negative  Sonohysterogram performed, sterile technique, single-tooth tenaculum anterior lip stabilization with subsequent easy catheter introduction, good distention with no abnormalities seen.  Endometrial biopsy taken.  Patient tolerated well.  Assessment/Plan:  58 y.o. G2P0000 with pelvic cramping which resolved.  Endocervical polyp noted and removed.  Sonohysterogram shows empty cavity with thin endometrial echo.  Endometrial biopsy taken.  Reviewed with patient biopsy may be inadequate which would be acceptable given the thin endometrium and no history of bleeding.  Assuming pathology acceptable then will plan expectant management and she will follow-up if she has any subsequent bleeding or return of cramping.    Dara Lords MD, 11:51 AM 04/21/2019

## 2019-04-21 NOTE — Addendum Note (Signed)
Addended by: Dayna Barker on: 04/21/2019 12:08 PM   Modules accepted: Orders

## 2019-04-21 NOTE — Patient Instructions (Signed)
Office will call you with biopsy results 

## 2019-04-25 ENCOUNTER — Encounter: Payer: Self-pay | Admitting: Gynecology

## 2019-05-05 ENCOUNTER — Telehealth: Payer: Self-pay | Admitting: *Deleted

## 2019-05-05 NOTE — Telephone Encounter (Signed)
Patient informed. 

## 2019-05-05 NOTE — Telephone Encounter (Signed)
I would watch for now.  It is so close to the sonohysterogram that she still could be having some healing along the lining of the uterus from the catheter placement.  If irregular bleeding would continue at some point she should consider having her hormone dosages adjusted to see if that does not resolve the bleeding.  I believe she is seeing somebody else for that and will have to follow-up with them.

## 2019-05-05 NOTE — Telephone Encounter (Signed)
Patient called to follow up from OV on 04/21/19. Patient said last night spotting started bright red only when wiping, today the spotting is more so brown color wearing a thin panty liner and notes brown blood on liner.  Also said the pelvic cramping has returned, dull feeling at times. patient was told to call if any should return. Please advise

## 2019-09-06 ENCOUNTER — Encounter: Payer: Self-pay | Admitting: Gynecology

## 2020-01-15 ENCOUNTER — Other Ambulatory Visit: Payer: Self-pay

## 2020-01-15 ENCOUNTER — Ambulatory Visit (HOSPITAL_COMMUNITY)
Admission: EM | Admit: 2020-01-15 | Discharge: 2020-01-15 | Disposition: A | Discharge status: Home / Self Care | Payer: Managed Care, Other (non HMO) | Attending: Emergency Medicine | Admitting: Emergency Medicine

## 2020-01-15 ENCOUNTER — Encounter (HOSPITAL_COMMUNITY): Payer: Self-pay | Admitting: Emergency Medicine

## 2020-01-15 DIAGNOSIS — T07XXXA Unspecified multiple injuries, initial encounter: Secondary | ICD-10-CM

## 2020-01-15 DIAGNOSIS — S0992XA Unspecified injury of nose, initial encounter: Secondary | ICD-10-CM

## 2020-01-15 DIAGNOSIS — R040 Epistaxis: Secondary | ICD-10-CM | POA: Diagnosis not present

## 2020-01-15 MED ORDER — OXYMETAZOLINE HCL 0.05 % NA SOLN
NASAL | Status: AC
  Filled 2020-01-15: qty 30

## 2020-01-15 MED ORDER — OXYMETAZOLINE HCL 0.05 % NA SOLN: 1.0000 | Freq: Two times a day (BID) | NASAL | Status: DC

## 2020-01-15 NOTE — ED Triage Notes (Signed)
Pt states prior to arrival, was wakling her dog and her dog took off and pulled her down. Pt fell on her face in the grass and scraped her hands. States she was bleeding out of her nose but the bleeding as stopped. Denies facial pain.

## 2020-01-15 NOTE — Discharge Instructions (Addendum)
Use anti-inflammatories for pain/swelling. You may take up to 800 mg Ibuprofen every 8 hours with food. You may supplement Ibuprofen with Tylenol [phone] mg every 8 hours.   Keep hands clean and dry, monitor for any signs of infection  For future nosebleeds may try using 1 to 2 sprays of Afrin in affected nostril, hold firm direct pressure over tip of nose and lean forward, see attached  If pain over nose not resolving, developing issues with breathing in nose please follow-up with ear nose and throat If nosebleed not stopping in 20-30 minutes with firm pressure please follow-up in person

## 2020-01-16 NOTE — ED Provider Notes (Signed)
[facility]    CSN: [id number] Arrival date & time: 01/15/20  1745      History   Chief Complaint Chief Complaint  Patient presents with  . Fall    HPI Julia Murphy is a 59 y.o. female no contributing past medical history presenting today for evaluation of nasal injury after fall.  Patient was walking her dog who caused her to accidentally trip and fall.  She landed face forward and hit the grass.  She caught herself with her hands.  She sustained abrasions to her hands, but denies any difficulty bending fingers or wrist.  They mainly just feels sore from the abrasions.  Her main concern was possible nose fracture.  She has had some pain and some mild swelling over her nose.  Immediately after she had a nosebleed which lasted for approximately 10 minutes.  This resolved spontaneously with pressure.  Denies any difficulty breathing.  Denies any vision changes.  Denies pain around eyes.  HPI  Past Medical History:  Diagnosis Date  . Sprain of neck 08/17/2014  . Thyroid disease     Patient Active Problem List   Diagnosis Date Noted  . Sprain of neck 08/17/2014  . Headache(784.0) 08/17/2014  . S/P laparoscopic appendectomy 04/25/2014    Past Surgical History:  Procedure Laterality Date  . FOREARM SURGERY    . LAPAROSCOPIC APPENDECTOMY N/A 04/25/2014   Procedure: APPENDECTOMY LAPAROSCOPIC;  Surgeon: Liz Malady, MD;  Location: [facility];  Service: General;  Laterality: N/A;  . RECONSTRUCTION MANDIBLE    . TONSILLECTOMY      OB History    Gravida  2   Para      Term      Preterm      AB  0   Living        SAB      TAB      Ectopic  0   Multiple      Live Births               Home Medications    Prior to Admission medications   Medication Sig Start Date End Date Taking? Authorizing Provider  cholecalciferol (VITAMIN D) 1000 UNITS tablet Take 1,000 Units by mouth daily.    [provider]  estradiol (CLIMARA - DOSED IN MG/24  HR) 0.075 mg/24hr patch Place 0.075 mg onto the skin 2 (two) times a week. Monday Thursday    [provider]  metFORMIN (GLUCOPHAGE) 500 MG tablet Take 500 mg by mouth daily. 03/18/15   [provider]  Multiple Vitamins-Minerals (MULTIVITAMIN WITH MINERALS) tablet Take 1 tablet by mouth daily.    [provider]  NONFORMULARY OR COMPOUNDED ITEM Estrogen Pellets    [provider]  progesterone (PROMETRIUM) 200 MG capsule Take 200 mg by mouth daily.    [provider]  Thyroid (NATURE-THROID) 97.5 MG TABS Take 1 tablet by mouth daily.     [provider]    Family History Family History  Problem Relation Age of Onset  . Cancer Paternal Uncle        colon  . Heart attack Father   . Congestive Heart Failure Father   . Dementia Mother   . Hypertension Mother     Social History Social History   Tobacco Use  . Smoking status: Never Smoker  . Smokeless tobacco: Never Used  Substance Use Topics  . Alcohol use: Yes    Alcohol/week: 0.0 standard drinks  Comment: occ  . Drug use: No     Allergies   Gluten meal   Review of Systems Review of Systems  Constitutional: Negative for activity change, chills, diaphoresis and fatigue.  HENT: Positive for facial swelling and nosebleeds. Negative for ear pain, tinnitus and trouble swallowing.   Eyes: Negative for photophobia, pain and visual disturbance.  Respiratory: Negative for cough, chest tightness and shortness of breath.   Cardiovascular: Negative for chest pain and leg swelling.  Gastrointestinal: Negative for abdominal pain, blood in stool, nausea and vomiting.  Musculoskeletal: Negative for arthralgias, back pain, gait problem, myalgias, neck pain and neck stiffness.  Skin: Negative for color change and wound.  Neurological: Negative for dizziness, weakness, light-headedness, numbness and headaches.     Physical Exam Triage Vital Signs ED Triage Vitals  Enc Vitals  Group     BP 01/15/20 1800 (!) 145/73     Pulse Rate 01/15/20 1800 83     Resp 01/15/20 1800 18     Temp 01/15/20 1800 97.9 F (36.6 C)     Temp src --      SpO2 01/15/20 1800 98 %     Weight --      Height --      Head Circumference --      Peak Flow --      Pain Score 01/15/20 1803 0     Pain Loc --      Pain Edu? --      Excl. in [place]? --    No data found.  Updated Vital Signs BP (!) 145/73   Pulse 83   Temp 97.9 F (36.6 C)   Resp 18   SpO2 98%   Visual Acuity Right Eye Distance:   Left Eye Distance:   Bilateral Distance:    Right Eye Near:   Left Eye Near:    Bilateral Near:     Physical Exam Vitals and nursing note reviewed.  Constitutional:      Appearance: She is well-developed.     Comments: No acute distress  HENT:     Head: Normocephalic and atraumatic.     Comments: No swelling or bruising noted and infraorbital areas    Ears:     Comments: No hemotympanum bilaterally    Nose: Nose normal.     Comments: Dried blood noted to anterior medial aspect of right nares, no other sources of blood noted more superiorly within nares, no blood noted within left nares  Tender to palpation over superior nasal bridge, mild overlying swelling, minimal discoloration Eyes:     Extraocular Movements: Extraocular movements intact.     Conjunctiva/sclera: Conjunctivae normal.     Pupils: Pupils are equal, round, and reactive to light.     Comments: No erythema, nontender to palpation to periorbital areas  Cardiovascular:     Rate and Rhythm: Normal rate.  Pulmonary:     Effort: Pulmonary effort is normal. No respiratory distress.     Comments: Breathing comfortably at rest Abdominal:     General: There is no distension.  Musculoskeletal:        General: Normal range of motion.     Cervical back: Neck supple.     Comments: Full active range of motion of bilateral wrists and all fingers.  Radial pulse 2+  Skin:    General: Skin is warm and dry.     Comments:  Superficial abrasions noted to bilateral palmar and dorsum surface of hands  Neurological:  Mental Status: She is alert and oriented to person, place, and time.      UC Treatments / Results  Labs (all labs ordered are listed, but only abnormal results are displayed) Labs Reviewed - No data to display  EKG   Radiology No results found.  Procedures Procedures (including critical care time)  Medications Ordered in UC Medications - No data to display  Initial Impression / Assessment and Plan / UC Course  I have reviewed the triage vital signs and the nursing notes.  Pertinent labs & imaging results that were available during my care of the patient were reviewed by me and considered in my medical decision making (see chart for details).     Nosebleed resolved spontaneously.  Discussed future recommendations for any further nosebleeds.  Provided Afrin to have on hand to use as needed.  Nose midline, does have some tenderness and mild swelling, discussed possible nasal fracture, this would heal over time.  Deferring imaging at this time, no airway compromise.  May follow-up with ENT if having persistent issues.  Do not suspect complications at this time.  Superficial abrasions on bilateral hands, discussed wound care.  Discussed strict return precautions. Patient verbalized understanding and is agreeable with plan.  Final Clinical Impressions(s) / UC Diagnoses   Final diagnoses:  Injury of nose, initial encounter  Epistaxis  Multiple abrasions     Discharge Instructions     Use anti-inflammatories for pain/swelling. You may take up to 800 mg Ibuprofen every 8 hours with food. You may supplement Ibuprofen with Tylenol [phone] mg every 8 hours.   Keep hands clean and dry, monitor for any signs of infection  For future nosebleeds may try using 1 to 2 sprays of Afrin in affected nostril, hold firm direct pressure over tip of nose and lean forward, see attached  If  pain over nose not resolving, developing issues with breathing in nose please follow-up with ear nose and throat If nosebleed not stopping in 20-30 minutes with firm pressure please follow-up in person   ED Prescriptions    None     PDMP not reviewed this encounter.   Lew Dawes, [place] 01/16/20 [phone]

## 2020-12-20 ENCOUNTER — Ambulatory Visit
Admission: RE | Admit: 2020-12-20 | Discharge: 2020-12-20 | Disposition: A | Discharge status: Home / Self Care | Payer: Managed Care, Other (non HMO) | Source: Ambulatory Visit | Attending: Internal Medicine | Admitting: Internal Medicine

## 2020-12-20 ENCOUNTER — Other Ambulatory Visit: Payer: Self-pay | Admitting: Internal Medicine

## 2020-12-20 DIAGNOSIS — R002 Palpitations: Secondary | ICD-10-CM

## 2024-04-29 ENCOUNTER — Emergency Department (HOSPITAL_BASED_OUTPATIENT_CLINIC_OR_DEPARTMENT_OTHER)
Admission: RE | Admit: 2024-04-29 | Discharge: 2024-04-29 | Disposition: A | Discharge status: Home / Self Care | Source: Ambulatory Visit | Attending: Emergency Medicine | Admitting: Emergency Medicine

## 2024-04-29 ENCOUNTER — Emergency Department (HOSPITAL_BASED_OUTPATIENT_CLINIC_OR_DEPARTMENT_OTHER)

## 2024-04-29 ENCOUNTER — Other Ambulatory Visit: Payer: Self-pay

## 2024-04-29 ENCOUNTER — Emergency Department (HOSPITAL_BASED_OUTPATIENT_CLINIC_OR_DEPARTMENT_OTHER)
Admission: EM | Admit: 2024-04-29 | Discharge: 2024-04-29 | Disposition: A | Discharge status: Home / Self Care | Attending: Emergency Medicine | Admitting: Emergency Medicine

## 2024-04-29 ENCOUNTER — Encounter (HOSPITAL_BASED_OUTPATIENT_CLINIC_OR_DEPARTMENT_OTHER): Payer: Self-pay

## 2024-04-29 DIAGNOSIS — R110 Nausea: Secondary | ICD-10-CM | POA: Diagnosis not present

## 2024-04-29 DIAGNOSIS — R1013 Epigastric pain: Secondary | ICD-10-CM | POA: Insufficient documentation

## 2024-04-29 DIAGNOSIS — R1011 Right upper quadrant pain: Secondary | ICD-10-CM | POA: Insufficient documentation

## 2024-04-29 LAB — CBC
HCT: 39.9 % (ref 36.0–46.0)
Hemoglobin: 13.7 g/dL (ref 12.0–15.0)
MCH: 31.9 pg (ref 26.0–34.0)
MCHC: 34.3 g/dL (ref 30.0–36.0)
MCV: 92.8 fL (ref 80.0–100.0)
Platelets: 402 10*3/uL — ABNORMAL HIGH (ref 150–400)
RBC: 4.3 MIL/uL (ref 3.87–5.11)
RDW: 12.7 % (ref 11.5–15.5)
WBC: 11.1 10*3/uL — ABNORMAL HIGH (ref 4.0–10.5)
nRBC: 0 % (ref 0.0–0.2)

## 2024-04-29 LAB — COMPREHENSIVE METABOLIC PANEL WITH GFR
ALT: 22 U/L (ref 0–44)
AST: 48 U/L — ABNORMAL HIGH (ref 15–41)
Albumin: 4.1 g/dL (ref 3.5–5.0)
Alkaline Phosphatase: 83 U/L (ref 38–126)
Anion gap: 12 (ref 5–15)
BUN: 21 mg/dL (ref 8–23)
CO2: 25 mmol/L (ref 22–32)
Calcium: 9.2 mg/dL (ref 8.9–10.3)
Chloride: 103 mmol/L (ref 98–111)
Creatinine, Ser: 0.81 mg/dL (ref 0.44–1.00)
GFR, Estimated: >60 mL/min (ref >60)
Glucose, Bld: 135 mg/dL — ABNORMAL HIGH (ref 70–99)
Potassium: 4.1 mmol/L (ref 3.5–5.1)
Sodium: 140 mmol/L (ref 135–145)
Total Bilirubin: 0.3 mg/dL (ref 0.0–1.2)
Total Protein: 6.6 g/dL (ref 6.5–8.1)

## 2024-04-29 LAB — TROPONIN T, HIGH SENSITIVITY: Troponin T High Sensitivity: <15 ng/L (ref <19)

## 2024-04-29 LAB — LIPASE, BLOOD: Lipase: 29 U/L (ref 11–51)

## 2024-04-29 MED ORDER — FENTANYL CITRATE PF 50 MCG/ML IJ SOSY
50.0000 ug | PREFILLED_SYRINGE | INTRAMUSCULAR | Status: DC | PRN
  Administered 2024-04-29: 50 ug via INTRAVENOUS
  Filled 2024-04-29: qty 1

## 2024-04-29 MED ORDER — FENTANYL CITRATE PF 50 MCG/ML IJ SOSY
50.0000 ug | PREFILLED_SYRINGE | Freq: Once | INTRAMUSCULAR | Status: AC
  Administered 2024-04-29: 50 ug via INTRAVENOUS
  Filled 2024-04-29: qty 1

## 2024-04-29 MED ORDER — IOHEXOL 300 MG/ML  SOLN
100.0000 mL | Freq: Once | INTRAMUSCULAR | Status: AC | PRN
  Administered 2024-04-29: 100 mL via INTRAVENOUS

## 2024-04-29 MED ORDER — ONDANSETRON HCL 4 MG/2ML IJ SOLN
4.0000 mg | Freq: Once | INTRAMUSCULAR | Status: AC
  Administered 2024-04-29: 4 mg via INTRAVENOUS
  Filled 2024-04-29: qty 2

## 2024-04-29 NOTE — Discharge Instructions (Signed)
Abdominal (belly) pain can be caused by many things. any cases can be observed and treated at home after initial evaluation in the emergency department. Even though you are being discharged home, abdominal pain can be unpredictable. Therefore, you need a repeated exam if your pain does not resolve, returns, or worsens. Most patients with abdominal pain don't have to be admitted to the hospital or have surgery, but serious problems like appendicitis and gallbladder attacks can start out as nonspecific pain. Many abdominal conditions cannot be diagnosed in one visit, so follow-up evaluations are very important. °SEEK IMMEDIATE MEDICAL ATTENTION IF: °The pain does not go away or becomes severe, particularly over the next 8-12 hours.  °A temperature above 100.4F develops.  °Repeated vomiting occurs (multiple episodes).  °The pain becomes localized to portions of the abdomen.   In an adult, the left lower portion of the abdomen could be colitis or diverticulitis.  °Blood is being passed in stools or vomit (bright red or black tarry stools).  °Return also if you develop chest pain, difficulty breathing, dizziness or fainting, or become confused, poorly responsive, or inconsolable. ° °

## 2024-04-29 NOTE — ED Triage Notes (Signed)
 Pt POV reporting severe RUQ pain that began 1hr ago. Denies n/v, unable to sit still in triage due to pain.

## 2024-04-29 NOTE — ED Provider Notes (Signed)
 Julia Murphy Provider Note   CSN: [id number] Arrival date & time: 04/29/24  7829     History  Chief Complaint  Patient presents with   Abdominal Pain    Julia Murphy is a 63 y.o. female.  The history is provided by the patient and the spouse.  Patient presents with upper abdominal pain.  She reports around midnight she started having severe epigastric and right upper quadrant pain that she thought was gastritis.  However it became severe and worse than previous episodes.  She reports nausea without vomiting but did feel sweaty.  At times the pain did seem to radiate to her chest but originated in her abdomen.  No back pain.  No change in her bowel movements, no recent bloody or dark stools.  Previous appendectomy. She had otherwise been at her baseline    Past Medical History:  Diagnosis Date   Sprain of neck 08/17/2014   Thyroid  disease     Home Medications Prior to Admission medications   Medication Sig Start Date End Date Taking? Authorizing Provider  cholecalciferol (VITAMIN D) 1000 UNITS tablet Take 1,000 Units by mouth daily.    [provider]  estradiol (CLIMARA - DOSED IN MG/24 HR) 0.075 mg/24hr patch Place 0.075 mg onto the skin 2 (two) times a week. Monday Thursday    [provider]  metFORMIN (GLUCOPHAGE) 500 MG tablet Take 500 mg by mouth daily. 03/18/15   [provider]  Multiple Vitamins-Minerals (MULTIVITAMIN WITH MINERALS) tablet Take 1 tablet by mouth daily.    [provider]  NONFORMULARY OR COMPOUNDED ITEM Estrogen Pellets    [provider]  progesterone  (PROMETRIUM ) 200 MG capsule Take 200 mg by mouth daily.    [provider]  Thyroid  (NATURE-THROID) 97.5 MG TABS Take 1 tablet by mouth daily.     [provider]      Allergies    Gluten meal    Review of Systems   Review of Systems  Gastrointestinal:  Positive for abdominal pain and nausea.  Negative for blood in stool and vomiting.    Physical Exam Updated Vital Signs BP (!) 106/57   Pulse 71   Temp 97.7 F (36.5 C)   Resp 12   Ht 1.727 m (5\' 8" )   Wt 76.2 kg   SpO2 97%   BMI 25.54 kg/m  Physical Exam CONSTITUTIONAL: Well developed/well nourished HEAD: Normocephalic/atraumatic EYES: EOMI/PERRL, no icterus ENMT: Mucous membranes moist NECK: supple no meningeal signs CV: S1/S2 noted, no murmurs/rubs/gallops noted LUNGS: Lungs are clear to auscultation bilaterally, no apparent distress ABDOMEN: soft, moderate epigastric and right upper quadrant tenderness, no rebound or guarding, bowel sounds noted throughout abdomen GU:no cva tenderness NEURO: Pt is awake/alert/appropriate, moves all extremitiesx4.  No facial droop.   EXTREMITIES: pulses normal/equal, full ROM SKIN: warm, color normal PSYCH: no abnormalities of mood noted, alert and oriented to situation  ED Results / Procedures / Treatments   Labs (all labs ordered are listed, but only abnormal results are displayed) Labs Reviewed  COMPREHENSIVE METABOLIC PANEL WITH GFR - Abnormal; Notable for the following components:      Result Value   Glucose, Bld 135 (*)    AST 48 (*)    All other components within normal limits  CBC - Abnormal; Notable for the following components:   WBC 11.1 (*)    Platelets 402 (*)    All other components within normal limits  LIPASE, BLOOD  TROPONIN T, HIGH SENSITIVITY    EKG EKG Interpretation Date/Time:  Friday Apr 29 2024 01:16:27 EDT Ventricular Rate:  68 PR Interval:  166 QRS Duration:  109 QT Interval:  415 QTC Calculation: 442 R Axis:   60  Text Interpretation: Sinus rhythm RSR' in V1 or V2, right VCD or RVH No previous ECGs available Confirmed by Eldon Greenland ([id number]) on 04/29/2024 1:30:06 AM  Radiology CT ABDOMEN PELVIS W CONTRAST Result Date: 04/29/2024 CLINICAL DATA:  Left lower quadrant abdominal pain. Right upper abdominal pain EXAM: CT ABDOMEN AND  PELVIS WITH CONTRAST TECHNIQUE: Multidetector CT imaging of the abdomen and pelvis was performed using the standard protocol following bolus administration of intravenous contrast. RADIATION DOSE REDUCTION: This exam was performed according to the departmental dose-optimization program which includes automated exposure control, adjustment of the mA and/or kV according to patient size and/or use of iterative reconstruction technique. CONTRAST:  OMNIPAQUE IOHEXOL 300 MG/ML  SOLN COMPARISON:  04/25/2014 FINDINGS: Lower chest:  No contributory findings. Hepatobiliary: No focal liver abnormality. Small scattered hepatic cysts no evidence of biliary obstruction or stone. Pancreas: Unremarkable. Spleen: Unremarkable. Adrenals/Urinary Tract: Negative adrenals. No hydronephrosis or stone. Tiny bilateral renal cystic densities. Unremarkable bladder. Stomach/Bowel: No obstruction. Appendectomy. No bowel inflammation seen today. Generalized colonic stool. Vascular/Lymphatic: No acute vascular abnormality. No mass or adenopathy. Reproductive:No pathologic findings. Other: No ascites or pneumoperitoneum. Musculoskeletal: No acute abnormalities. Lumbar spine degeneration with slight L4-5 anterolisthesis. IMPRESSION: No acute finding. Generalized colonic stool without obstruction or inflammation. Electronically Signed   By: Ronnette Coke M.D.   On: 04/29/2024 04:15    Procedures Procedures    Medications Ordered in ED Medications  fentaNYL  (SUBLIMAZE ) injection 50 mcg (50 mcg Intravenous Given 04/29/24 0114)  fentaNYL  (SUBLIMAZE ) injection 50 mcg (50 mcg Intravenous Given 04/29/24 0239)  ondansetron  (ZOFRAN ) injection 4 mg (4 mg Intravenous Given 04/29/24 0239)  iohexol (OMNIPAQUE) 300 MG/ML solution 100 mL (100 mLs Intravenous Contrast Given 04/29/24 0354)    ED Course/ Medical Decision Making/ A&P Clinical Course as of 04/29/24 0500  Fri Apr 29, 2024  0341 Patient initially had upper abdominal pain, now  having left lower quadrant pain, will obtain CT imaging [DW]  0458 Overall patient feels improved.  No acute findings on CT imaging, though may benefit from outpatient ultrasound for evaluation of biliary cause Low suspicion for cardiac cause of pain as the pain originates in her abdomen [DW]  0459 Patient feels comfortable discharge home and outpatient follow-up [DW]    Clinical Course User Index [DW] Eldon Greenland, MD                                 Medical Decision Making Amount and/or Complexity of Data Reviewed Labs: ordered. Radiology: ordered.  Risk Prescription drug management.   This patient presents to the ED for concern of upper abdominal pain, this involves an extensive number of treatment options, and is a complaint that carries with it a high risk of complications and morbidity.  The differential diagnosis includes but is not limited to cholecystitis, cholelithiasis, pancreatitis, gastritis, peptic ulcer disease, bowel obstruction, bowel perforation, AAA, ACS   Comorbidities that complicate the patient evaluation: Patient's presentation is complicated by their history of long COVID   Additional history obtained: Additional history obtained from spouse   Lab Tests: I Ordered, and personally interpreted labs.  The pertinent results include: Mild leukocytosis  Imaging Studies ordered: I ordered imaging  studies including CT scan abdomen pelvis  I independently visualized and interpreted imaging which showed no acute finding I agree with the radiologist interpretation   Medicines ordered and prescription drug management: I ordered medication including IV fentanyl  for analgesia Reevaluation of the patient after these medicines showed that the patient    improved   Reevaluation: After the interventions noted above, I reevaluated the patient and found that they have :improved  Complexity of problems addressed: Patient's presentation is most consistent with   acute presentation with potential threat to life or bodily function  Disposition: After consideration of the diagnostic results and the patient's response to treatment,  I feel that the patent would benefit from discharge  .           Final Clinical Impression(s) / ED Diagnoses Final diagnoses:  RUQ pain    Rx / DC Orders ED Discharge Orders          Ordered    US  Abdomen Limited RUQ/Gall Bladder        04/29/24 0455              Eldon Greenland, MD 04/29/24 0500

## 2024-04-29 NOTE — ED Notes (Addendum)
 Radiology in to speak with patient regarding a time for her to return for u/s.

## 2024-04-30 ENCOUNTER — Ambulatory Visit (HOSPITAL_BASED_OUTPATIENT_CLINIC_OR_DEPARTMENT_OTHER)
Admission: RE | Admit: 2024-04-30 | Discharge: 2024-04-30 | Disposition: A | Discharge status: Home / Self Care | Source: Ambulatory Visit | Attending: Emergency Medicine | Admitting: Emergency Medicine

## 2024-04-30 DIAGNOSIS — R1011 Right upper quadrant pain: Secondary | ICD-10-CM | POA: Insufficient documentation

## 2024-04-30 NOTE — ED Provider Notes (Addendum)
 Patient's ultrasound shows a distended gallbladder with stones.  Could fit in as the cause of her right upper quadrant abdominal pain.  Patient's labs when seen yesterday without any acute findings.  Also ultrasound showed no evidence of acute cholecystitis.  Patient will need to follow-up with general surgery for consideration for gallbladder removal.   Shamone Winzer, MD 04/30/24 1411   Patient is currently asymptomatic.  She is followed by [facility] primary care.  She will follow-up with her primary care doctor next week.  She is given precautions to return for any right upper quadrant pain or upper abdominal pain that lasts for more than 3 hours.   Chantee Cerino, MD 04/30/24 1415

## 2024-05-31 NOTE — Progress Notes (Signed)
 Sent message, via epic in basket, requesting orders in epic from Careers adviser.

## 2024-06-01 NOTE — Patient Instructions (Signed)
 SURGICAL WAITING ROOM VISITATION Patients having surgery or a procedure may have no more than 2 support people in the waiting area - these visitors may rotate in the visitor waiting room.   If the patient needs to stay at the hospital during part of their recovery, the visitor guidelines for inpatient rooms apply.  PRE-OP VISITATION  Pre-op nurse will coordinate an appropriate time for 1 support person to accompany the patient in pre-op.  This support person may not rotate.  This visitor will be contacted when the time is appropriate for the visitor to come back in the pre-op area.  Please refer to the [facility] website for the visitor guidelines for Inpatients (after your surgery is over and you are in a regular room).  You are not required to quarantine at this time prior to your surgery. However, you must do this: Hand Hygiene often Do NOT share personal items Notify your provider if you are in close contact with someone who has COVID or you develop fever 100.4 or greater, new onset of sneezing, cough, sore throat, shortness of breath or body aches.  If you test positive for Covid or have been in contact with anyone that has tested positive in the last 10 days please notify you surgeon.    Your procedure is scheduled on:  TUESDAY  June 07, 2024  Report to [facility] Main Entrance: [address] entrance where the [employer] is available.   Report to admitting at:12:15 PM  Call this number if you have any questions or problems the morning of surgery [phone]  FOLLOW ANY ADDITIONAL PRE OP INSTRUCTIONS YOU RECEIVED FROM YOUR SURGEON'S OFFICE!!!  Do not eat food after Midnight the night prior to your surgery/procedure.  After Midnight you may have the following liquids until  11:30 AM DAY OF SURGERY  Clear Liquid Diet Water Black Coffee (sugar ok, NO MILK/CREAM OR CREAMERS)  Tea (sugar ok, NO MILK/CREAM OR CREAMERS) regular and decaf                             Plain  Jell-O  with no fruit (NO RED)                                           Fruit ices (not with fruit pulp, NO RED)                                     Popsicles (NO RED)                                                                  Juice: NO CITRUS JUICES: only apple, WHITE grape, WHITE cranberry Sports drinks like Gatorade or Powerade (NO RED)                Oral Hygiene is also important to reduce your risk of infection.        Remember - BRUSH YOUR TEETH THE MORNING OF SURGERY WITH YOUR REGULAR TOOTHPASTE  Do NOT smoke after Midnight the night  before surgery.  STOP TAKING all Vitamins, Herbs and supplements 1 week before your surgery.   METFORMIN:  Do not take this on the morning of your surgery.  Take ONLY these medicines the morning of surgery with A SIP OF WATER: NP thyroid , Propranolol, and Alprazolam if needed.                     You may not have any metal on your body including hair pins, jewelry, and body piercing  Do not wear make-up, lotions, powders, perfumes or deodorant  Do not wear nail polish including gel and S&S, artificial / acrylic nails, or any other type of covering on natural nails including finger and toenails. If you have artificial nails, gel coating, etc., that needs to be removed by a nail salon, Please have this removed prior to surgery. Not doing so may mean that your surgery could be cancelled or delayed if the Surgeon or anesthesia staff feels like they are unable to monitor you safely.   Do not shave 48 hours prior to surgery to avoid nicks in your skin which may contribute to postoperative infections.    Contacts, Hearing Aids, dentures or bridgework may not be worn into surgery. DENTURES WILL BE REMOVED PRIOR TO SURGERY PLEASE DO NOT APPLY Poly grip OR ADHESIVES!!!  Patients discharged on the day of surgery will not be allowed to drive home.  Someone NEEDS to stay with you for the first 24 hours after anesthesia.  Do not bring your home  medications to the hospital. The Pharmacy will dispense medications listed on your medication list to you during your admission in the Hospital.  Please read over the following fact sheets you were given: IF YOU HAVE QUESTIONS ABOUT YOUR PRE-OP INSTRUCTIONS, PLEASE CALL [phone]   [facility] - Preparing for Surgery Before surgery, you can play an important role.  Because skin is not sterile, your skin needs to be as free of germs as possible.  You can reduce the number of germs on your skin by washing with CHG (chlorahexidine gluconate) soap before surgery.  CHG is an antiseptic cleaner which kills germs and bonds with the skin to continue killing germs even after washing. Please DO NOT use if you have an allergy to CHG or antibacterial soaps.  If your skin becomes reddened/irritated stop using the CHG and inform your nurse when you arrive at Short Stay. Do not shave (including legs and underarms) for at least 48 hours prior to the first CHG shower.  You may shave your face/neck.  Please follow these instructions carefully:  1.  Shower with CHG Soap the night before surgery and the  morning of surgery.  2.  If you choose to wash your hair, wash your hair first as usual with your normal  shampoo.  3.  After you shampoo, rinse your hair and body thoroughly to remove the shampoo.                             4.  Use CHG as you would any other liquid soap.  You can apply chg directly to the skin and wash.  Gently with a scrungie or clean washcloth.  5.  Apply the CHG Soap to your body ONLY FROM THE NECK DOWN.   Do not use on face/ open  Wound or open sores. Avoid contact with eyes, ears mouth and genitals (private parts).                       Wash face,  Genitals (private parts) with your normal soap.             6.  Wash thoroughly, paying special attention to the area where your  surgery  will be performed.  7.  Thoroughly rinse your body with warm water from the neck  down.  8.  DO NOT shower/wash with your normal soap after using and rinsing off the CHG Soap.            9.  Pat yourself dry with a clean towel.            10.  Wear clean pajamas.            11.  Place clean sheets on your bed the night of your first shower and do not  sleep with pets.  ON THE DAY OF SURGERY : Do not apply any lotions/deodorants the morning of surgery.  Please wear clean clothes to the hospital/surgery center.     FAILURE TO FOLLOW THESE INSTRUCTIONS MAY RESULT IN THE CANCELLATION OF YOUR SURGERY  PATIENT SIGNATURE_________________________________  NURSE SIGNATURE__________________________________  ________________________________________________________________________

## 2024-06-01 NOTE — Progress Notes (Signed)
 COVID Vaccine received:  []  No [x]  Yes Date of any COVID positive Test in last 90 days:  PCP - Trula Brim, MD at [facility], [phone] (Work)   [phone] (Fax Cardiologist -Susannah HERO Al-Ghandour, PA-C  at [facility] Heart / vascular   [phone] (Work)  [phone] (Fax)   Chest x-ray - 12-20-2020   2v  Epic EKG -  05-04-2024  Epic Stress Test -  ECHO -  Cardiac Cath -  CT Coronary Calcium score:   Bowel Prep - [x]  No  []   Yes ______  Pacemaker / ICD device [x]  No []  Yes   Spinal Cord Stimulator:[x]  No []  Yes       History of Sleep Apnea? [x]  No []  Yes  normal study 12-2023 CPAP used?- [x]  No []  Yes    Does the patient monitor blood sugar?   []  N/A   []  No []  Yes  Patient has: []  NO Hx DM   [x]  Pre-DM   []  DM1  []   DM2 Last A1c was:        on    Metformin 500 mg w/ supper,   hold DOS    Blood Thinner / Instructions: none Aspirin Instructions: none  ERAS Protocol Ordered: []  No  []  Yes PRE-SURGERY []  ENSURE  []  G2   []  No Drink Ordered  Patient is to be NPO after:    No Orders  Dental hx: []  Dentures:  []  N/A      []  Bridge or Partial:                   []  Loose or Damaged teeth:   Comments:   Activity level: Able to walk up 2 flights of stairs without becoming significantly short of breath or having chest pain?  []  No   []    Yes   Anesthesia review:   Patient denies shortness of breath, fever, cough and chest pain at PAT appointment.  Patient verbalized understanding and agreement to the Pre-Surgical Instructions that were given to them at this PAT appointment. Patient was also educated of the need to review these PAT instructions again prior to her surgery.I reviewed the appropriate phone numbers to call if they have any and questions or concerns.

## 2024-06-02 ENCOUNTER — Encounter (HOSPITAL_COMMUNITY): Payer: Self-pay

## 2024-06-02 ENCOUNTER — Ambulatory Visit: Payer: Self-pay | Admitting: General Surgery

## 2024-06-02 ENCOUNTER — Other Ambulatory Visit: Payer: Self-pay

## 2024-06-02 ENCOUNTER — Encounter (HOSPITAL_COMMUNITY)
Admission: RE | Admit: 2024-06-02 | Discharge: 2024-06-02 | Disposition: A | Discharge status: Home / Self Care | Source: Ambulatory Visit | Attending: General Surgery | Admitting: General Surgery

## 2024-06-02 VITALS — BP 106/72 | HR 76 | Temp 98.2°F | Resp 16 | Ht 67.5 in | Wt 162.0 lb

## 2024-06-02 DIAGNOSIS — R7303 Prediabetes: Secondary | ICD-10-CM | POA: Insufficient documentation

## 2024-06-02 DIAGNOSIS — R7401 Elevation of levels of liver transaminase levels: Secondary | ICD-10-CM

## 2024-06-02 DIAGNOSIS — Z01812 Encounter for preprocedural laboratory examination: Secondary | ICD-10-CM | POA: Insufficient documentation

## 2024-06-02 DIAGNOSIS — I10 Essential (primary) hypertension: Secondary | ICD-10-CM | POA: Diagnosis not present

## 2024-06-02 DIAGNOSIS — Z01818 Encounter for other preprocedural examination: Secondary | ICD-10-CM

## 2024-06-02 DIAGNOSIS — K8020 Calculus of gallbladder without cholecystitis without obstruction: Secondary | ICD-10-CM | POA: Insufficient documentation

## 2024-06-02 HISTORY — DX: Prediabetes: R73.03

## 2024-06-02 HISTORY — DX: Hypothyroidism, unspecified: E03.9

## 2024-06-02 HISTORY — DX: Post covid-19 condition, unspecified: U09.9

## 2024-06-02 HISTORY — DX: Anxiety disorder, unspecified: F41.9

## 2024-06-02 HISTORY — DX: Familial dysautonomia (riley-day): G90.1

## 2024-06-02 HISTORY — DX: Unspecified osteoarthritis, unspecified site: M19.90

## 2024-06-02 HISTORY — DX: Anemia, unspecified: D64.9

## 2024-06-02 HISTORY — DX: Postural orthostatic tachycardia syndrome (POTS): G90.A

## 2024-06-02 HISTORY — DX: Essential (primary) hypertension: I10

## 2024-06-02 LAB — BASIC METABOLIC PANEL WITH GFR
Anion gap: 12 (ref 5–15)
BUN: 19 mg/dL (ref 8–23)
CO2: 23 mmol/L (ref 22–32)
Calcium: 9.3 mg/dL (ref 8.9–10.3)
Chloride: 104 mmol/L (ref 98–111)
Creatinine, Ser: 0.79 mg/dL (ref 0.44–1.00)
GFR, Estimated: >60 mL/min (ref >60)
Glucose, Bld: 117 mg/dL — ABNORMAL HIGH (ref 70–99)
Potassium: 4.5 mmol/L (ref 3.5–5.1)
Sodium: 139 mmol/L (ref 135–145)

## 2024-06-02 LAB — CBC
HCT: 42.2 % (ref 36.0–46.0)
Hemoglobin: 13.7 g/dL (ref 12.0–15.0)
MCH: 31.1 pg (ref 26.0–34.0)
MCHC: 32.5 g/dL (ref 30.0–36.0)
MCV: 95.7 fL (ref 80.0–100.0)
Platelets: 365 10*3/uL (ref 150–400)
RBC: 4.41 MIL/uL (ref 3.87–5.11)
RDW: 12.5 % (ref 11.5–15.5)
WBC: 7.2 10*3/uL (ref 4.0–10.5)
nRBC: 0 % (ref 0.0–0.2)

## 2024-06-03 NOTE — Progress Notes (Signed)
 Anesthesia Chart Review   Case: [id number] Date/Time: 06/07/24 1415   Procedure: LAPAROSCOPIC CHOLECYSTECTOMY - WITH ICG DYE   Anesthesia type: General   Diagnosis: Symptomatic cholelithiasis [K80.20]   Pre-op diagnosis: SYMPTOMATIC CHOLELITHIASIS   Location: [facility] ROOM 01 / [facility]   Surgeons: Tanda Locus, MD       DISCUSSION:63 y.o. never smoker with h/o HTN, POTS, dysautonomia, Hashimoto's, symptomatic cholelithiasis scheduled for above procedure 06/07/2024 with Dr. Locus Tanda.   Pt follows with cardio for post-COVID dysautonomia, last seen 04/28/2024.  Stable at this visit.  Will remain on propanolol 10-15 mg TID. 1 year follow up recommended.   VS: BP 106/72 Comment: right arm sitting  Pulse 76   Temp 36.8 C (Oral)   Resp 16   Ht 5' 7.5 (1.715 m)   Wt 73.5 kg   SpO2 98%   BMI 25.00 kg/m   PROVIDERS: Delice Charleston, MD (Inactive)   LABS: Labs reviewed: Acceptable for surgery. (all labs ordered are listed, but only abnormal results are displayed)  Labs Reviewed  BASIC METABOLIC PANEL WITH GFR - Abnormal; Notable for the following components:      Result Value   Glucose, Bld 117 (*)    All other components within normal limits  CBC     IMAGES:   EKG:   CV:  Past Medical History:  Diagnosis Date   Anemia    Anxiety    Arthritis    Dysautonomia (HCC)    Hypertension    Hypothyroidism    Hashimoto's   Long COVID    POTS (postural orthostatic tachycardia syndrome)    Pre-diabetes    Sprain of neck 08/17/2014   Thyroid  disease     Past Surgical History:  Procedure Laterality Date   COLONOSCOPY W/ POLYPECTOMY     x 3   EYE SURGERY Bilateral 2013   Lasik   FOREARM SURGERY Left 1971   ORIF   LAPAROSCOPIC APPENDECTOMY N/A 04/25/2014   Procedure: APPENDECTOMY LAPAROSCOPIC;  Surgeon: Dann FORBES Hummer, MD;  Location: [facility] OR;  Service: General;  Laterality: N/A;   RECONSTRUCTION MANDIBLE  1992   for TMJ pain.   TONSILLECTOMY      MEDICATIONS:   Creatine POWD   ALPRAZolam (XANAX) 0.25 MG tablet   Cholecalciferol (VITAMIN D3 PO)   EPINEPHrine  0.3 mg/0.3 mL IJ SOAJ injection   estradiol (VIVELLE-DOT) 0.0375 MG/24HR   Estriol POWD   ibuprofen  (ADVIL ) 200 MG tablet   Magnesium Citrate POWD   MAGNESIUM PO   MELATONIN PO   metFORMIN (GLUCOPHAGE-XR) 500 MG 24 hr tablet   Misc Natural Products (ADRENAL PO)   Misc Natural Products (TURMERIC, CURCUMIN, PO)   NP THYROID  60 MG tablet   ondansetron  (ZOFRAN ) 4 MG tablet   Oral Electrolytes (FT ELECTROLYTE PO)   Probiotic Product (PROBIOTIC PO)   PROGESTERONE  PO   propranolol (INDERAL) 10 MG tablet   No current facility-administered medications for this encounter.    Harlene Hoots Ward, PA-C [facility] Pre-Surgical Testing [phone]

## 2024-06-07 ENCOUNTER — Ambulatory Visit: Payer: Self-pay | Admitting: General Surgery

## 2024-06-07 ENCOUNTER — Ambulatory Visit (HOSPITAL_COMMUNITY)
Admission: RE | Admit: 2024-06-07 | Discharge: 2024-06-07 | Disposition: A | Discharge status: Home / Self Care | Attending: General Surgery | Admitting: General Surgery

## 2024-06-07 ENCOUNTER — Ambulatory Visit (HOSPITAL_COMMUNITY): Admitting: Certified Registered Nurse Anesthetist

## 2024-06-07 ENCOUNTER — Encounter (HOSPITAL_COMMUNITY): Payer: Self-pay | Admitting: Physician Assistant

## 2024-06-07 ENCOUNTER — Encounter (HOSPITAL_COMMUNITY)
Admission: RE | Disposition: A | Discharge status: Home / Self Care | Payer: Self-pay | Source: Home / Self Care | Attending: General Surgery

## 2024-06-07 ENCOUNTER — Encounter (HOSPITAL_COMMUNITY): Payer: Self-pay | Admitting: General Surgery

## 2024-06-07 ENCOUNTER — Other Ambulatory Visit: Payer: Self-pay

## 2024-06-07 DIAGNOSIS — U099 Post covid-19 condition, unspecified: Secondary | ICD-10-CM | POA: Insufficient documentation

## 2024-06-07 DIAGNOSIS — G901 Familial dysautonomia [Riley-Day]: Secondary | ICD-10-CM | POA: Diagnosis not present

## 2024-06-07 DIAGNOSIS — K8010 Calculus of gallbladder with chronic cholecystitis without obstruction: Secondary | ICD-10-CM | POA: Insufficient documentation

## 2024-06-07 DIAGNOSIS — R7401 Elevation of levels of liver transaminase levels: Secondary | ICD-10-CM

## 2024-06-07 DIAGNOSIS — K8020 Calculus of gallbladder without cholecystitis without obstruction: Secondary | ICD-10-CM | POA: Diagnosis not present

## 2024-06-07 DIAGNOSIS — I10 Essential (primary) hypertension: Secondary | ICD-10-CM | POA: Insufficient documentation

## 2024-06-07 DIAGNOSIS — G90A Postural orthostatic tachycardia syndrome (POTS): Secondary | ICD-10-CM | POA: Diagnosis not present

## 2024-06-07 DIAGNOSIS — Z9049 Acquired absence of other specified parts of digestive tract: Secondary | ICD-10-CM | POA: Insufficient documentation

## 2024-06-07 DIAGNOSIS — Z9089 Acquired absence of other organs: Secondary | ICD-10-CM | POA: Insufficient documentation

## 2024-06-07 HISTORY — PX: CHOLECYSTECTOMY: SHX55

## 2024-06-07 LAB — HEPATIC FUNCTION PANEL
ALT: 19 U/L (ref 0–44)
AST: 20 U/L (ref 15–41)
Albumin: 4.1 g/dL (ref 3.5–5.0)
Alkaline Phosphatase: 84 U/L (ref 38–126)
Bilirubin, Direct: 0.1 mg/dL (ref 0.0–0.2)
Indirect Bilirubin: 0.9 mg/dL (ref 0.3–0.9)
Total Bilirubin: 1 mg/dL (ref 0.0–1.2)
Total Protein: 7.4 g/dL (ref 6.5–8.1)

## 2024-06-07 SURGERY — LAPAROSCOPIC CHOLECYSTECTOMY
Anesthesia: General

## 2024-06-07 MED ORDER — SPY AGENT GREEN - (INDOCYANINE FOR INJECTION)
1.2500 mg | Freq: Once | INTRAMUSCULAR | Status: AC
  Administered 2024-06-07: 1.25 mg via INTRAVENOUS
  Filled 2024-06-07: qty 10

## 2024-06-07 MED ORDER — CHLORHEXIDINE GLUCONATE 0.12 % MT SOLN
15.0000 mL | Freq: Once | OROMUCOSAL | Status: AC
  Administered 2024-06-07: 15 mL via OROMUCOSAL

## 2024-06-07 MED ORDER — DROPERIDOL 2.5 MG/ML IJ SOLN
0.6250 mg | Freq: Once | INTRAMUSCULAR | Status: AC
  Administered 2024-06-07: 0.625 mg via INTRAVENOUS

## 2024-06-07 MED ORDER — CHLORHEXIDINE GLUCONATE CLOTH 2 % EX PADS: 6.0000 | MEDICATED_PAD | Freq: Once | CUTANEOUS | Status: DC

## 2024-06-07 MED ORDER — AMISULPRIDE (ANTIEMETIC) 5 MG/2ML IV SOLN
10.0000 mg | Freq: Once | INTRAVENOUS | Status: AC
  Administered 2024-06-07: 10 mg via INTRAVENOUS

## 2024-06-07 MED ORDER — PROPOFOL 500 MG/50ML IV EMUL
INTRAVENOUS | Status: DC | PRN
  Administered 2024-06-07: 250 ug/kg/min via INTRAVENOUS

## 2024-06-07 MED ORDER — PHENYLEPHRINE 80 MCG/ML (10ML) SYRINGE FOR IV PUSH (FOR BLOOD PRESSURE SUPPORT)
PREFILLED_SYRINGE | INTRAVENOUS | Status: AC
  Filled 2024-06-07: qty 10

## 2024-06-07 MED ORDER — SUGAMMADEX SODIUM 200 MG/2ML IV SOLN
INTRAVENOUS | Status: DC | PRN
  Administered 2024-06-07: 200 mg via INTRAVENOUS

## 2024-06-07 MED ORDER — PROPOFOL 10 MG/ML IV BOLUS
INTRAVENOUS | Status: AC
  Filled 2024-06-07: qty 20

## 2024-06-07 MED ORDER — PROPOFOL 10 MG/ML IV BOLUS
INTRAVENOUS | Status: DC | PRN
  Administered 2024-06-07: 170 mg via INTRAVENOUS

## 2024-06-07 MED ORDER — DEXMEDETOMIDINE HCL 200 MCG/2ML IV SOLN
20.0000 ug | Freq: Once | INTRAVENOUS | Status: AC
  Administered 2024-06-07: 20 ug via INTRAVENOUS

## 2024-06-07 MED ORDER — LIDOCAINE 2% (20 MG/ML) 5 ML SYRINGE
INTRAMUSCULAR | Status: DC | PRN
  Administered 2024-06-07: 60 mg via INTRAVENOUS

## 2024-06-07 MED ORDER — AMISULPRIDE (ANTIEMETIC) 5 MG/2ML IV SOLN
INTRAVENOUS | Status: AC
  Filled 2024-06-07: qty 4

## 2024-06-07 MED ORDER — OXYCODONE HCL 5 MG PO TABS: 5.0000 mg | ORAL_TABLET | Freq: Four times a day (QID) | ORAL | 0 refills | Status: AC | PRN

## 2024-06-07 MED ORDER — ACETAMINOPHEN 10 MG/ML IV SOLN: 1000.0000 mg | Freq: Once | INTRAVENOUS | Status: DC | PRN

## 2024-06-07 MED ORDER — LACTATED RINGERS IR SOLN
Status: DC | PRN
  Administered 2024-06-07: 1000 mL

## 2024-06-07 MED ORDER — DEXMEDETOMIDINE HCL IN NACL 80 MCG/20ML IV SOLN
INTRAVENOUS | Status: AC
  Filled 2024-06-07: qty 20

## 2024-06-07 MED ORDER — FENTANYL CITRATE (PF) 100 MCG/2ML IJ SOLN
INTRAMUSCULAR | Status: AC
  Filled 2024-06-07: qty 2

## 2024-06-07 MED ORDER — DEXAMETHASONE SODIUM PHOSPHATE 10 MG/ML IJ SOLN
INTRAMUSCULAR | Status: DC | PRN
  Administered 2024-06-07: 10 mg via INTRAVENOUS

## 2024-06-07 MED ORDER — ONDANSETRON HCL 4 MG/2ML IJ SOLN
INTRAMUSCULAR | Status: AC
  Filled 2024-06-07: qty 2

## 2024-06-07 MED ORDER — HYDROMORPHONE HCL 1 MG/ML IJ SOLN
0.2500 mg | INTRAMUSCULAR | Status: DC | PRN
  Administered 2024-06-07 (×2): 0.5 mg via INTRAVENOUS

## 2024-06-07 MED ORDER — HYDROMORPHONE HCL 1 MG/ML IJ SOLN
INTRAMUSCULAR | Status: DC | PRN
  Administered 2024-06-07 (×2): 1 mg via INTRAVENOUS

## 2024-06-07 MED ORDER — HYDROMORPHONE HCL 1 MG/ML IJ SOLN
INTRAMUSCULAR | Status: AC
  Filled 2024-06-07: qty 1

## 2024-06-07 MED ORDER — DROPERIDOL 2.5 MG/ML IJ SOLN
INTRAMUSCULAR | Status: AC
Start: 2024-06-07 — End: 2024-06-07
  Filled 2024-06-07: qty 2

## 2024-06-07 MED ORDER — DEXAMETHASONE SODIUM PHOSPHATE 10 MG/ML IJ SOLN
INTRAMUSCULAR | Status: AC
  Filled 2024-06-07: qty 1

## 2024-06-07 MED ORDER — SODIUM CHLORIDE 0.9 % IV SOLN: INTRAVENOUS | Status: DC

## 2024-06-07 MED ORDER — ROCURONIUM BROMIDE 10 MG/ML (PF) SYRINGE
PREFILLED_SYRINGE | INTRAVENOUS | Status: AC
  Filled 2024-06-07: qty 10

## 2024-06-07 MED ORDER — BUPIVACAINE-EPINEPHRINE 0.25% -1:200000 IJ SOLN
INTRAMUSCULAR | Status: DC | PRN
  Administered 2024-06-07: 30 mL

## 2024-06-07 MED ORDER — HEMOSTATIC AGENTS (NO CHARGE) OPTIME
TOPICAL | Status: DC | PRN
  Administered 2024-06-07: 1 via TOPICAL

## 2024-06-07 MED ORDER — PHENYLEPHRINE 80 MCG/ML (10ML) SYRINGE FOR IV PUSH (FOR BLOOD PRESSURE SUPPORT)
PREFILLED_SYRINGE | INTRAVENOUS | Status: DC | PRN
  Administered 2024-06-07: 160 ug via INTRAVENOUS

## 2024-06-07 MED ORDER — FENTANYL CITRATE PF 50 MCG/ML IJ SOSY: 25.0000 ug | PREFILLED_SYRINGE | INTRAMUSCULAR | Status: DC | PRN

## 2024-06-07 MED ORDER — MIDAZOLAM HCL 2 MG/2ML IJ SOLN
INTRAMUSCULAR | Status: AC
  Filled 2024-06-07: qty 2

## 2024-06-07 MED ORDER — CEFAZOLIN SODIUM-DEXTROSE 2-4 GM/100ML-% IV SOLN
2.0000 g | INTRAVENOUS | Status: AC
  Administered 2024-06-07: 2 g via INTRAVENOUS
  Filled 2024-06-07: qty 100

## 2024-06-07 MED ORDER — FENTANYL CITRATE (PF) 100 MCG/2ML IJ SOLN
INTRAMUSCULAR | Status: DC | PRN
  Administered 2024-06-07 (×2): 50 ug via INTRAVENOUS

## 2024-06-07 MED ORDER — SODIUM CHLORIDE 0.9 % IR SOLN
Status: DC | PRN
  Administered 2024-06-07: 1000 mL

## 2024-06-07 MED ORDER — LIDOCAINE HCL (PF) 2 % IJ SOLN
INTRAMUSCULAR | Status: AC
  Filled 2024-06-07: qty 20

## 2024-06-07 MED ORDER — STERILE WATER FOR IRRIGATION IR SOLN
Status: DC | PRN
  Administered 2024-06-07: 1000 mL

## 2024-06-07 MED ORDER — HYDROMORPHONE HCL 2 MG/ML IJ SOLN
INTRAMUSCULAR | Status: AC
  Filled 2024-06-07: qty 1

## 2024-06-07 MED ORDER — ORAL CARE MOUTH RINSE: 15.0000 mL | Freq: Once | OROMUCOSAL | Status: AC

## 2024-06-07 MED ORDER — ONDANSETRON HCL 4 MG/2ML IJ SOLN
INTRAMUSCULAR | Status: DC | PRN
  Administered 2024-06-07 (×2): 4 mg via INTRAVENOUS

## 2024-06-07 MED ORDER — ROCURONIUM BROMIDE 10 MG/ML (PF) SYRINGE
PREFILLED_SYRINGE | INTRAVENOUS | Status: DC | PRN
  Administered 2024-06-07: 60 mg via INTRAVENOUS

## 2024-06-07 MED ORDER — BUPIVACAINE-EPINEPHRINE (PF) 0.25% -1:200000 IJ SOLN
INTRAMUSCULAR | Status: AC
  Filled 2024-06-07: qty 30

## 2024-06-07 MED ORDER — LACTATED RINGERS IV SOLN: INTRAVENOUS | Status: DC

## 2024-06-07 MED ORDER — ACETAMINOPHEN 500 MG PO TABS: 1000.0000 mg | ORAL_TABLET | Freq: Three times a day (TID) | ORAL | Status: AC

## 2024-06-07 MED ORDER — ACETAMINOPHEN 500 MG PO TABS: 1000.0000 mg | ORAL_TABLET | ORAL | Status: DC

## 2024-06-07 SURGICAL SUPPLY — 42 items
APPLICATOR ARISTA FLEXITIP XL (MISCELLANEOUS) IMPLANT
BAG COUNTER SPONGE SURGICOUNT (BAG) IMPLANT
CABLE HIGH FREQUENCY MONO STRZ (ELECTRODE) ×2 IMPLANT
CHLORAPREP W/TINT 26 (MISCELLANEOUS) ×2 IMPLANT
CLIP APPLIE 5 13 M/L LIGAMAX5 (MISCELLANEOUS) IMPLANT
CLIP APPLIE ROT 10 11.4 M/L (STAPLE) IMPLANT
CLIP LIGATING HEMO O LOK GREEN (MISCELLANEOUS) IMPLANT
COVER MAYO STAND XLG (MISCELLANEOUS) IMPLANT
COVER SURGICAL LIGHT HANDLE (MISCELLANEOUS) ×2 IMPLANT
DERMABOND ADVANCED .7 DNX12 (GAUZE/BANDAGES/DRESSINGS) IMPLANT
DRAPE C-ARM 42X120 X-RAY (DRAPES) IMPLANT
DRSG TEGADERM 2-3/8X2-3/4 SM (GAUZE/BANDAGES/DRESSINGS) ×6 IMPLANT
DRSG TEGADERM 4X4.75 (GAUZE/BANDAGES/DRESSINGS) ×2 IMPLANT
ELECT REM PT RETURN 15FT ADLT (MISCELLANEOUS) ×2 IMPLANT
GAUZE SPONGE 2X2 8PLY STRL LF (GAUZE/BANDAGES/DRESSINGS) ×2 IMPLANT
GLOVE BIO SURGEON STRL SZ7.5 (GLOVE) ×2 IMPLANT
GLOVE INDICATOR 8.0 STRL GRN (GLOVE) ×2 IMPLANT
GOWN STRL REUS W/ TWL XL LVL3 (GOWN DISPOSABLE) ×2 IMPLANT
GRASPER SUT TROCAR 14GX15 (MISCELLANEOUS) IMPLANT
HEMOSTAT ARISTA ABSORB 3G PWDR (HEMOSTASIS) IMPLANT
HEMOSTAT SNOW SURGICEL 2X4 (HEMOSTASIS) IMPLANT
IRRIGATION SUCT STRKRFLW 2 WTP (MISCELLANEOUS) ×2 IMPLANT
KIT BASIN OR (CUSTOM PROCEDURE TRAY) ×2 IMPLANT
KIT TURNOVER KIT A (KITS) ×2 IMPLANT
LHOOK LAP DISP 36CM (ELECTROSURGICAL) IMPLANT
POUCH RETRIEVAL ECOSAC 10 (ENDOMECHANICALS) ×2 IMPLANT
SCISSORS LAP 5X35 DISP (ENDOMECHANICALS) ×2 IMPLANT
SET CHOLANGIOGRAPH MIX (MISCELLANEOUS) IMPLANT
SET TUBE SMOKE EVAC HIGH FLOW (TUBING) ×2 IMPLANT
SLEEVE ADV FIXATION 5X100MM (TROCAR) ×2 IMPLANT
SPIKE FLUID TRANSFER (MISCELLANEOUS) ×2 IMPLANT
STRIP CLOSURE SKIN 1/2X4 (GAUZE/BANDAGES/DRESSINGS) ×2 IMPLANT
SUT MNCRL AB 4-0 PS2 18 (SUTURE) ×2 IMPLANT
SUT VIC AB 0 UR5 27 (SUTURE) IMPLANT
SUT VICRYL 0 TIES 12 18 (SUTURE) IMPLANT
SUT VICRYL 0 UR6 27IN ABS (SUTURE) IMPLANT
TOWEL OR 17X26 10 PK STRL BLUE (TOWEL DISPOSABLE) ×2 IMPLANT
TRAY LAPAROSCOPIC (CUSTOM PROCEDURE TRAY) ×2 IMPLANT
TROCAR ADV FIXATION 12X100MM (TROCAR) IMPLANT
TROCAR ADV FIXATION 5X100MM (TROCAR) ×2 IMPLANT
TROCAR BALLN 12MMX100 BLUNT (TROCAR) IMPLANT
TROCAR XCEL NON-BLD 5MMX100MML (ENDOMECHANICALS) IMPLANT

## 2024-06-07 NOTE — Op Note (Signed)
 Julia Murphy [id number] 1961/06/20 06/07/2024  Laparoscopic Cholecystectomy with near infrared fluorescent cholangiography procedure Note  Indications: This patient presents with symptomatic gallbladder disease and will undergo laparoscopic cholecystectomy.  Pre-operative Diagnosis: Symptomatic cholelithiasis  Post-operative Diagnosis: Same  Surgeon: Camellia Blush MD FACS  Assistants: Mae Flock MD PGY-4 Surgical resident  Circulator: Gill Comer LABOR, RN Relief Scrub: Mac Stacia CROME Scrub Person: Gretta Leonor CROME, RN Circulator Assistant: Claudene Lum SAILOR   Anesthesia: General endotracheal anesthesia   Procedure Details  The patient was seen again in the Holding Room. The risks, benefits, complications, treatment options, and expected outcomes were discussed with the patient. The possibilities of reaction to medication, pulmonary aspiration, perforation of viscus, bleeding, recurrent infection, finding a normal gallbladder, the need for additional procedures, failure to diagnose a condition, the possible need to convert to an open procedure, and creating a complication requiring transfusion or operation were discussed with the patient. The likelihood of improving the patient's symptoms with return to their baseline status is good.  The patient and/or family concurred with the proposed plan, giving informed consent. The site of surgery properly noted. The patient was taken to Operating Room, identified as Julia Murphy and the procedure verified as Laparoscopic Cholecystectomy with ICG dye.  A Time Out was held and the above information confirmed. Antibiotic prophylaxis was administered.    ICG dye was administered preoperatively.    General endotracheal anesthesia was then administered and tolerated well. After the induction, the abdomen was prepped with Chloraprep and draped in the sterile fashion. The patient was positioned in the supine position.  Local anesthetic agent was  injected into the skin near the umbilicus and an incision made we used an old small infraumbilical incision from her prior laparoscopic procedure.. We dissected down to the abdominal fascia with blunt dissection.  The fascia was incised vertically and we entered the peritoneal cavity bluntly.  A pursestring suture of 0-Vicryl was placed around the fascial opening.  The Hasson cannula was inserted and secured with the stay suture.  Pneumoperitoneum was then created with CO2 and tolerated well without any adverse changes in the patient's vital signs. An 5-mm port was placed in the subxiphoid position.  Two 5-mm ports were placed in the right upper quadrant. All skin incisions were infiltrated with a local anesthetic agent before making the incision and placing the trocars.   We positioned the patient in reverse Trendelenburg, tilted slightly to the patient's left.  The gallbladder was identified, the fundus grasped and retracted cephalad. Adhesions were lysed bluntly and with the electrocautery where indicated, taking care not to injure any adjacent organs or viscus. The infundibulum was grasped and retracted laterally, exposing the peritoneum overlying the triangle of Calot. This was then divided and exposed in a blunt fashion. A critical view of the cystic duct and cystic artery was obtained.  The patient had a small posterior branch of her cystic artery as well.  The cystic duct was clearly identified and bluntly dissected circumferentially.  Utilizing the Stryker camera system near infrared fluorescent cholangiography activity was visualized in the liver, cystic duct, common hepatic duct and common bile duct and small bowel.  This served as a secondary confirmation of our anatomy.  The cystic duct was then ligated with clips and divided. The cystic artery (both the anterior and posterior branch) which had been identified & dissected free were ligated with clips and divided as well.   The gallbladder was  dissected from the liver bed in retrograde  fashion with the electrocautery. The gallbladder was removed and placed in an Ecco sac.  The gallbladder and Ecco sac were then removed through the umbilical port site. The liver bed was irrigated and inspected. Hemostasis was achieved with the electrocautery. Copious irrigation was utilized and was repeatedly aspirated until clear.  I elected to place a piece of surgical snow in the gallbladder fossa the pursestring suture was used to close the umbilical fascia.  She had some inherent weakness at the umbilical fascia.  So we placed 2 additional interrupted 0 Vicryl's at the umbilical fascia using PMI suture passer with laparoscopic assistance.  We again inspected the right upper quadrant for hemostasis.  The umbilical closure was inspected and there was no air leak and nothing trapped within the closure. Pneumoperitoneum was released as we removed the trocars.  4-0 Monocryl was used to close the skin.   dermabond was applied. The patient was then extubated and brought to the recovery room in stable condition. Instrument, sponge, and needle counts were correct at closure and at the conclusion of the case.   Findings: critical view Infrared fluorescent cholangiography visualized within the common hepatic duct, common bile duct, cystic duct and small bowel Positive snow  Estimated Blood Loss: Minimal         Drains: none         Specimens: Gallbladder           Complications: None; patient tolerated the procedure well.         Disposition: PACU - hemodynamically stable.         Condition: stable  I was personally present & scrubbed during the entire procedure, as documented in my operative note.   Camellia HERO. Tanda, MD, FACS General, Bariatric, & Minimally Invasive Surgery [facility] Surgery,  [facility]

## 2024-06-07 NOTE — Anesthesia Procedure Notes (Signed)
 Procedure Name: Intubation Date/Time: 06/07/2024 2:24 PM  Performed by: Nanci Riis, CRNAPre-anesthesia Checklist: Patient identified, Emergency Drugs available, Suction available, Patient being monitored and Timeout performed Patient Re-evaluated:Patient Re-evaluated prior to induction Oxygen Delivery Method: Circle system utilized Preoxygenation: Pre-oxygenation with 100% oxygen Induction Type: IV induction Ventilation: Mask ventilation without difficulty Laryngoscope Size: Miller and 3 Grade View: Grade II Tube type: Oral Tube size: 7.0 mm Number of attempts: 1 Airway Equipment and Method: Stylet Placement Confirmation: ETT inserted through vocal cords under direct vision, positive ETCO2 and breath sounds checked- equal and bilateral Secured at: 21 cm Tube secured with: Tape Dental Injury: Teeth and Oropharynx as per pre-operative assessment

## 2024-06-07 NOTE — Anesthesia Preprocedure Evaluation (Signed)
 Anesthesia Evaluation  Patient identified by MRN, date of birth, ID band Patient awake    Reviewed: Allergy & Precautions, NPO status , Patient's Chart, lab work & pertinent test results  Airway Mallampati: II  TM Distance: >3 FB Neck ROM: Full    Dental no notable dental hx.    Pulmonary neg pulmonary ROS   Pulmonary exam normal        Cardiovascular hypertension,  Rhythm:Regular Rate:Normal     Neuro/Psych  Headaches  Anxiety        GI/Hepatic Neg liver ROS,,,Cholelithiasis    Endo/Other  Hypothyroidism    Renal/GU negative Renal ROS  negative genitourinary   Musculoskeletal  (+) Arthritis , Osteoarthritis,    Abdominal Normal abdominal exam  (+)   Peds  Hematology  (+) Blood dyscrasia, anemia Lab Results      Component                Value               Date                      WBC                      7.2                 06/02/2024                HGB                      13.7                06/02/2024                HCT                      42.2                06/02/2024                MCV                      95.7                06/02/2024                PLT                      365                 06/02/2024              Anesthesia Other Findings   Reproductive/Obstetrics                             Anesthesia Physical Anesthesia Plan  ASA: 2  Anesthesia Plan: General   Post-op Pain Management: Tylenol  PO (pre-op)*   Induction: Intravenous  PONV Risk Score and Plan: 3 and Ondansetron , Dexamethasone , Treatment may vary due to age or medical condition, Propofol  infusion and TIVA  Airway Management Planned: Mask and Oral ETT  Additional Equipment: None  Intra-op Plan:   Post-operative Plan: Extubation in OR  Informed Consent: I have reviewed the patients History and Physical, chart, labs and discussed the procedure including the risks, benefits and alternatives for the  proposed anesthesia with  the patient or authorized representative who has indicated his/her understanding and acceptance.     Dental advisory given  Plan Discussed with: CRNA  Anesthesia Plan Comments:        Anesthesia Quick Evaluation

## 2024-06-07 NOTE — Anesthesia Postprocedure Evaluation (Signed)
 Anesthesia Post Note  Patient: Julia Murphy  Procedure(s) Performed: LAPAROSCOPIC CHOLECYSTECTOMY     Patient location during evaluation: PACU Anesthesia Type: General Level of consciousness: awake and alert Pain management: pain level controlled Vital Signs Assessment: post-procedure vital signs reviewed and stable Respiratory status: spontaneous breathing, nonlabored ventilation, respiratory function stable and patient connected to nasal cannula oxygen Cardiovascular status: blood pressure returned to baseline and stable Postop Assessment: no apparent nausea or vomiting Anesthetic complications: no   No notable events documented.  Last Vitals:  Vitals:   06/07/24 1700 06/07/24 1800  BP:  132/69  Pulse: 65 68  Resp: 11 12  Temp:    SpO2: 98% 98%    Last Pain:  Vitals:   06/07/24 1800  TempSrc:   PainSc: 2                  Arial Galligan P Maquita Sandoval

## 2024-06-07 NOTE — Discharge Instructions (Signed)

## 2024-06-07 NOTE — Interval H&P Note (Signed)
 History and Physical Interval Note:  06/07/2024 1:48 PM  Julia Murphy  has presented today for surgery, with the diagnosis of SYMPTOMATIC CHOLELITHIASIS.  The various methods of treatment have been discussed with the patient and family. After consideration of risks, benefits and other options for treatment, the patient has consented to  Procedure(s) with comments: LAPAROSCOPIC CHOLECYSTECTOMY (N/A) - WITH ICG DYE as a surgical intervention.  The patient's history has been reviewed, patient examined, no change in status, stable for surgery.  I have reviewed the patient's chart and labs.  Questions were answered to the patient's satisfaction.    DISCUSSED surgery resident involvement with case  Camellia Blush

## 2024-06-07 NOTE — H&P (Signed)
 REFERRING PHYSICIAN: Stacia Millman, PA  PROVIDER: Rimsha Trembley DARALYN BLUSH, MD  MRN: [id number] DOB: 26-Feb-1961 DATE OF ENCOUNTER: 05/17/2024  Subjective  Chief Complaint: New Consultation (Consult for consideration of elective cholecystectomy)   History of Present Illness: Julia Murphy is a 63 y.o. female who is seen today as an office consultation at the request of Dr. Stacia for evaluation of New Consultation (Consult for consideration of elective cholecystectomy)  History of Present Illness Julia Murphy is a 63 year old female with long COVID and POTS who presents with abdominal pain.  She experienced severe abdominal pain on May 23, described as unbearable and worse than childbirth, located centrally in the upper abdomen with occasional radiation to the chest and sides. Associated symptoms included sweating and nausea without vomiting. Initial self-treatment with Pepcid AC and DGL was ineffective, leading to an emergency room visit.  Post-emergency visit, she experienced bloating and discomfort for about a week to ten days, with pain predominantly at night when lying down. She found relief by sleeping on her left side. Nausea persisted, for which she took Zofran  once.  She has a history of long COVID with POTS, diagnosed as dysautonomia, with a relapse of symptoms following a viral infection in February. Her heart rate has remained inconsistent since then. Surgical history includes appendectomy, tonsillectomy, forearm surgery, Lasix, and rhinoplasty.  She fears eating fatty foods due to pain, resulting in a recent weight loss of three pounds. She is cautious about her diet, avoiding high-fat foods. No shortness of breath while sitting, but sometimes during activities like shopping. Bowel movements occur once a day.  She sees cardiology at [facility] for her dysautonomia.    Review of Systems: A complete review of systems was obtained from the patient. I have reviewed this  information and discussed as appropriate with the patient. See HPI as well for other ROS.  ROS  Medical History: Past Medical History: Diagnosis Date Anxiety Arthritis Hyperlipidemia Thyroid  disease  Patient Active Problem List Diagnosis Dysautonomia (CMS/HHS-HCC)  Past Surgical History: Procedure Laterality Date APPENDECTOMY Lasik Eye Surgery RHINOPLASTY TONSILLECTOMY   No Known Allergies  Current Outpatient Medications on File Prior to Visit Medication Sig Dispense Refill cetirizine (ZYRTEC) 10 MG tablet cholecalciferol, vitamin D3, (VITAMIN D3) 125 mcg (5,000 unit) tablet estradiol (DOTTI) patch 0.0375 mg/24 hr Place 1 patch onto the skin twice a week melatonin 5 mg Chew 1 capsule at bedtime metFORMIN (GLUCOPHAGE-XR) 500 MG XR tablet TK 1 T PO QD WITH THE EVE MEAL multivitamin with minerals tablet Take 1 tablet by mouth once daily ondansetron  (ZOFRAN ) 4 MG tablet [id number] sul-NaCl-KCl-asb-C (PLENVU) 140-9-5.2 gram PPkS USE UTD  No current facility-administered medications on file prior to visit.  Family History Problem Relation Age of Onset Obesity Mother High blood pressure (Hypertension) Mother Coronary Artery Disease (Blocked arteries around heart) Father Hyperlipidemia (Elevated cholesterol) Brother Obesity Brother High blood pressure (Hypertension) Brother   Social History  Tobacco Use Smoking Status Never Smokeless Tobacco Never   Social History  Socioeconomic History Marital status: Married Tobacco Use Smoking status: Never Smokeless tobacco: Never Vaping Use Vaping status: Never Used Substance and Sexual Activity Alcohol use: Yes Alcohol/week: 0.0 - 1.0 standard drinks of alcohol Drug use: Never  Social Drivers of Health  Housing Stability: Unknown (05/17/2024) Housing Stability Vital Sign Homeless in the Last Year: No  Objective:  Vitals: 05/17/24 1503 05/17/24 1504 BP: 121/82 Pulse: 105 Temp: 36.9 C (98.4 F) SpO2:  98% Weight: 74.5 kg (164 lb 3.2 oz) Height: 172.7  cm (5' 8) PainSc: 0-No pain PainLoc: Abdomen  Body mass index is 24.97 kg/m.  Constitutional: NAD; conversant; no deformities Eyes: Moist conjunctiva; no lid lag; anicteric; PERRL Neck: Trachea midline; no thyromegaly Lungs: Normal respiratory effort; no tactile fremitus CV: RRR; no palpable thrills; no pitting edema GI: Abd soft, nontender, nondistended, old trocar scars; no palpable hepatosplenomegaly MSK: Normal gait; no clubbing/cyanosis Psychiatric: Appropriate affect; alert and oriented x3 Lymphatic: No palpable cervical or axillary lymphadenopathy Skin: No rash, lesions or jaundice  Labs, Imaging and Diagnostic Testing: RUQ u/s 04/30/24  FINDINGS: Gallbladder:  Gallbladder is distended. No wall thickening or adjacent fluid but there are shadowing stones. No reported sonographic Murphy's sign.  Common bile duct:  Diameter: 3 mm  Liver:  Preserved hepatic echotexture. There is a anechoic structure measuring 12 mm within the left hepatic lobe with through transmission consistent with a simple cyst. Portal vein is patent on color Doppler imaging with normal direction of blood flow towards the liver.  Other: None.  IMPRESSION: Distended gallbladder with stones. No further sonographic evidence of acute cholecystitis. No ductal dilatation.  Benign hepatic cyst.  CT ABD PEL 04/29/24 FINDINGS: Lower chest: No contributory findings.  Hepatobiliary: No focal liver abnormality. Small scattered hepatic cysts no evidence of biliary obstruction or stone.  Pancreas: Unremarkable.  Spleen: Unremarkable.  Adrenals/Urinary Tract: Negative adrenals. No hydronephrosis or stone. Tiny bilateral renal cystic densities. Unremarkable bladder.  Stomach/Bowel: No obstruction. Appendectomy. No bowel inflammation seen today. Generalized colonic stool.  Vascular/Lymphatic: No acute vascular abnormality. No mass  or adenopathy.  Reproductive:No pathologic findings.  Other: No ascites or pneumoperitoneum.  Musculoskeletal: No acute abnormalities. Lumbar spine degeneration with slight L4-5 anterolisthesis.  IMPRESSION: No acute finding.  Generalized colonic stool without obstruction or inflammation.  Labs 04/29/24 - cbc - wbc 11, cmet - bld sugar 135, ast 48  ED note 04/29/24  Viewed [facility] cardiology clinic note from Apr 28, 2024 Assessment and Plan:   Diagnoses and all orders for this visit:  Symptomatic cholelithiasis  Dysautonomia (CMS/HHS-HCC)    Assessment & Plan Gallbladder disease with gallstones Acute gallbladder attack on Apr 29, 2024, with severe upper abdominal pain, nausea, and sweating, consistent with gallstone-related pain. Risk of future attacks and potential gallbladder infection if untreated. Surgical removal of the gallbladder is recommended to prevent recurrence and complications. Laparoscopic cholecystectomy involves four incisions, camera insertion, and gallbladder extraction. Risks include infection, bile leak, and a rare risk of common bile duct injury. Surgery should be performed when the gallbladder is not infected to reduce complications. Patient request cardiac clearance is required due to long COVID and POTS. - Request cardiac clearance from [facility] cardiologist. - Schedule laparoscopic cholecystectomy for the last week of June or first week of July, pending cardiac clearance. - Provide preadmission testing appointment, likely via phone interview. - Advise on enhanced recovery protocol, allowing clear liquids up to three hours before surgery. - Prescribe acetaminophen  and ibuprofen  for postoperative pain management, with a stronger prescription for breakthrough pain. - Discuss potential need for laxatives post-surgery due to constipation risk from anesthesia.  .I believe the patient's symptoms are consistent with gallbladder disease.  We discussed gallbladder  disease. The patient was given Agricultural engineer. We discussed non-operative and operative management. We discussed the signs & symptoms of acute cholecystitis  I discussed laparoscopic cholecystectomy with possible IOC in detail. The patient was given educational material as well as diagrams detailing the procedure. We discussed the risks and benefits of a laparoscopic cholecystectomy including, but not  limited to bleeding, infection, injury to surrounding structures such as the intestine or liver, bile leak, retained gallstones, need to convert to an open procedure, prolonged diarrhea, blood clots such as DVT, common bile duct injury, anesthesia risks, and possible need for additional procedures. We discussed the typical post-operative recovery course. I explained that the likelihood of improvement of their symptoms is good.  She will be contacted to schedule surgery once we have received cardiac clearance from her [facility] cardiologist  Long COVID with dysautonomia Long COVID with POTS, diagnosed as dysautonomia. Symptoms include heart rate irregularities, exacerbated by a viral infection in February 2025. Current management includes propranolol. Patient has concerns about dehydration and heart rate management during surgery. Plan to ensure proper hydration and medication adherence on the day of surgery. - - Maintain hydration with IV saline during surgery. - Coordinate with anesthesia to review cardiology records and protocols.  This patient encounter took 45 minutes today to perform the following: take history, perform exam, review outside records, interpret imaging, counsel the patient on their diagnosis and document encounter, findings & plan in the EHR  No follow-ups on file.  Daniella Dewberry DARALYN BLUSH, MD General, Minimally Invasive, & Bariatric Surgery     Electronically signed by BLUSH Camellia DARALYN, MD at 05/17/2024 3:45 PM EDT

## 2024-06-07 NOTE — Transfer of Care (Signed)
 Immediate Anesthesia Transfer of Care Note  Patient: Julia Murphy  Procedure(s) Performed: LAPAROSCOPIC CHOLECYSTECTOMY  Patient Location: PACU  Anesthesia Type:General  Level of Consciousness: awake, alert , and oriented  Airway & Oxygen Therapy: Patient Spontanous Breathing and Patient connected to face mask oxygen  Post-op Assessment: Report given to RN, Post -op Vital signs reviewed and stable, and Patient moving all extremities X 4  Post vital signs: Reviewed and stable  Last Vitals:  Vitals Value Taken Time  BP 106/72 06/07/24 15:45  Temp 36.7 C 06/07/24 15:44  Pulse 82 06/07/24 15:46  Resp 14 06/07/24 15:46  SpO2 100 % 06/07/24 15:46  Vitals shown include unfiled device data.  Last Pain:  Vitals:   06/07/24 1237  TempSrc:   PainSc: 0-No pain         Complications: No notable events documented.

## 2024-06-08 ENCOUNTER — Encounter (HOSPITAL_COMMUNITY): Payer: Self-pay | Admitting: General Surgery

## 2024-06-09 LAB — SURGICAL PATHOLOGY

## 2024-10-26 ENCOUNTER — Other Ambulatory Visit (HOSPITAL_BASED_OUTPATIENT_CLINIC_OR_DEPARTMENT_OTHER): Payer: Self-pay | Admitting: Internal Medicine

## 2024-10-26 DIAGNOSIS — Z780 Asymptomatic menopausal state: Secondary | ICD-10-CM
